# Patient Record
Sex: Female | Born: 1955 | Hispanic: No | Marital: Married | Smoking: Current every day smoker
Health system: Southern US, Community
[De-identification: ages and names within clinical notes are randomized; demographics above are authoritative.]

## PROBLEM LIST (undated history)

## (undated) DIAGNOSIS — F411 Generalized anxiety disorder: Secondary | ICD-10-CM

## (undated) DIAGNOSIS — R591 Generalized enlarged lymph nodes: Secondary | ICD-10-CM

## (undated) DIAGNOSIS — R636 Underweight: Secondary | ICD-10-CM

## (undated) HISTORY — DX: Generalized enlarged lymph nodes: R59.1

## (undated) HISTORY — PX: ABDOMINAL HYSTERECTOMY: SHX81

## (undated) HISTORY — DX: Generalized anxiety disorder: F41.1

## (undated) HISTORY — DX: Underweight: R63.6

---

## 2004-12-27 ENCOUNTER — Other Ambulatory Visit: Admission: RE | Admit: 2004-12-27 | Discharge: 2004-12-27 | Payer: Self-pay | Admitting: Family Medicine

## 2006-04-21 ENCOUNTER — Other Ambulatory Visit: Admission: RE | Admit: 2006-04-21 | Discharge: 2006-04-21 | Payer: Self-pay | Admitting: Family Medicine

## 2010-12-03 ENCOUNTER — Ambulatory Visit (INDEPENDENT_AMBULATORY_CARE_PROVIDER_SITE_OTHER): Payer: Self-pay | Admitting: Internal Medicine

## 2013-11-01 ENCOUNTER — Ambulatory Visit (INDEPENDENT_AMBULATORY_CARE_PROVIDER_SITE_OTHER): Payer: PRIVATE HEALTH INSURANCE | Admitting: Family Medicine

## 2013-11-01 ENCOUNTER — Encounter: Payer: Self-pay | Admitting: Family Medicine

## 2013-11-01 ENCOUNTER — Telehealth: Payer: Self-pay | Admitting: General Practice

## 2013-11-01 ENCOUNTER — Encounter (INDEPENDENT_AMBULATORY_CARE_PROVIDER_SITE_OTHER): Payer: Self-pay

## 2013-11-01 VITALS — BP 108/67 | HR 80 | Temp 98.4°F | Ht 62.0 in | Wt 102.4 lb

## 2013-11-01 DIAGNOSIS — M549 Dorsalgia, unspecified: Secondary | ICD-10-CM

## 2013-11-01 MED ORDER — HYDROCODONE-ACETAMINOPHEN 5-325 MG PO TABS: 1.0000 | ORAL_TABLET | Freq: Four times a day (QID) | ORAL | Status: DC | PRN

## 2013-11-01 NOTE — Telephone Encounter (Signed)
appt for today given

## 2013-11-01 NOTE — Progress Notes (Signed)
   Subjective:    Patient ID: [name], female    DOB: 1955/09/11, 58 y.o.   MRN: [id number]  HPI This 58 y.o. female presents for evaluation of back pain.  She has been cleaning and mopping At work and her back has been becoming worse.   Review of Systems No chest pain, SOB, HA, dizziness, vision change, N/V, diarrhea, constipation, dysuria, urinary urgency or frequency, myalgias, arthralgias or rash.     Objective:   Physical Exam Vital signs noted  Well developed well nourished female.  HEENT - Head atraumatic Normocephalic                Eyes - PERRLA, Conjuctiva - clear Sclera- Clear EOMI               Ears - EAC's Wnl TM's Wnl Gross Hearing WNL                Nose - Nares patent                 Throat - oropharanx wnl Respiratory - Lungs CTA bilateral Cardiac - RRR S1 and S2 without murmur GI - Abdomen soft Nontender and bowel sounds active x 4 Extremities - No edema. Neuro - Grossly intact.       Assessment & Plan:  Back pain Norco 5/325 one po qid prn pain #30 Take motrin 800mg  one po tid x 10 days Out of work for a week. Heating pad to back, rest, no lifting or mopping and follow up in a week if not better.  Lysbeth Penner FNP

## 2013-11-07 ENCOUNTER — Ambulatory Visit (HOSPITAL_COMMUNITY)
Admission: RE | Admit: 2013-11-07 | Discharge: 2013-11-07 | Disposition: A | Discharge status: Home / Self Care | Payer: PRIVATE HEALTH INSURANCE | Source: Ambulatory Visit | Attending: Family Medicine | Admitting: Family Medicine

## 2013-11-07 ENCOUNTER — Ambulatory Visit (INDEPENDENT_AMBULATORY_CARE_PROVIDER_SITE_OTHER): Payer: PRIVATE HEALTH INSURANCE

## 2013-11-07 ENCOUNTER — Ambulatory Visit (INDEPENDENT_AMBULATORY_CARE_PROVIDER_SITE_OTHER): Payer: PRIVATE HEALTH INSURANCE | Admitting: Family Medicine

## 2013-11-07 ENCOUNTER — Encounter: Payer: Self-pay | Admitting: Family Medicine

## 2013-11-07 VITALS — BP 136/86 | HR 63 | Temp 97.8°F | Ht 62.0 in | Wt 102.0 lb

## 2013-11-07 DIAGNOSIS — B029 Zoster without complications: Secondary | ICD-10-CM

## 2013-11-07 DIAGNOSIS — M549 Dorsalgia, unspecified: Secondary | ICD-10-CM

## 2013-11-07 DIAGNOSIS — F411 Generalized anxiety disorder: Secondary | ICD-10-CM

## 2013-11-07 MED ORDER — ALPRAZOLAM 0.5 MG PO TABS: ORAL_TABLET | ORAL | Status: DC

## 2013-11-07 MED ORDER — ACYCLOVIR 800 MG PO TABS: 800.0000 mg | ORAL_TABLET | Freq: Every day | ORAL | Status: DC

## 2013-11-07 MED ORDER — HYDROCODONE-ACETAMINOPHEN 5-325 MG PO TABS: 2.0000 | ORAL_TABLET | Freq: Four times a day (QID) | ORAL | Status: DC | PRN

## 2013-11-07 NOTE — Addendum Note (Signed)
Addended by: Lysbeth Penner on: 11/07/2013 04:12 PM   Modules accepted: Orders

## 2013-11-07 NOTE — Progress Notes (Signed)
   Subjective:    Patient ID: Erika Turner, female    DOB: 1955-11-25, 58 y.o.   MRN: [id number]  HPI This 58 y.o. female presents for evaluation of severe right back pain radiating down her right knee to right foot.  She is having pain and difficulty walking.  She was seen last week for this and dx with sciatica.  She is intolerant to steroids and has been taking motrin 800mg  po tid.  She has a rash on her left back.   Review of Systems C/o rash and back pain radiating down to her foot No chest pain, SOB, HA, dizziness, vision change, N/V, diarrhea, constipation, dysuria, urinary urgency or frequency.     Objective:   Physical Exam  Vital signs noted  Well developed well nourished female.  HEENT - Head atraumatic Normocephalic                Eyes - PERRLA, Conjuctiva - clear Sclera- Clear EOMI                Ears - EAC's Wnl TM's Wnl Gross Hearing WNL                 Throat - oropharanx wnl Respiratory - Lungs CTA bilateral Cardiac - RRR S1 and S2 without murmur GI - Abdomen soft Nontender and bowel sounds active x 4 MS - TTP right back and weakness in her right foot  Skin - Shingles left back  Xray - scoliosis  Prelimnary reading by Gwyndolyn Saxon Trinh Sanjose,FNP    Assessment & Plan:  Back pain - Plan: DG Lumbar Spine 2-3 Views, MR Lumbar Spine Wo Contrast  Shingles - Plan: acyclovir (ZOVIRAX) 800 MG tablet po 5 x day x 7 days  Lysbeth Penner FNP

## 2013-11-08 ENCOUNTER — Ambulatory Visit: Payer: PRIVATE HEALTH INSURANCE | Admitting: Family Medicine

## 2013-11-08 ENCOUNTER — Encounter: Payer: Self-pay | Admitting: Family Medicine

## 2013-11-08 ENCOUNTER — Ambulatory Visit (INDEPENDENT_AMBULATORY_CARE_PROVIDER_SITE_OTHER): Payer: PRIVATE HEALTH INSURANCE | Admitting: Family Medicine

## 2013-11-08 VITALS — BP 112/59 | HR 59 | Temp 98.4°F | Ht 62.0 in | Wt 101.4 lb

## 2013-11-08 DIAGNOSIS — M549 Dorsalgia, unspecified: Secondary | ICD-10-CM

## 2013-11-08 NOTE — Progress Notes (Signed)
   Subjective:    Patient ID: Erika Turner, female    DOB: October 26, 1955, 58 y.o.   MRN: [id number]  HPI This 58 y.o. female presents for evaluation of back pain.  She has had MRI of the LS spine and it shows no stenosis or HNP and mild lumbar arthritis.  She is still having some back pain and is having Difficulty walking.   Review of Systems C/o back pain   No chest pain, SOB, HA, dizziness, vision change, N/V, diarrhea, constipation, dysuria, urinary urgency or frequency, myalgias, arthralgias or rash.  Objective:   Physical Exam Vital signs noted  Well developed well nourished female.  HEENT - Head atraumatic Normocephalic                Eyes - PERRLA, Conjuctiva - clear Sclera- Clear EOMI                Ears - EAC's Wnl TM's Wnl Gross Hearing WNL                Throat - oropharanx wnl Respiratory - Lungs CTA bilateral Cardiac - RRR S1 and S2 without murmur GI - Abdomen soft Nontender and bowel sounds active x 4 LS spine - TTP lumbar spine.       Assessment & Plan:  Back pain - Plan: Ambulatory referral to Physical Therapy Continue norco pain meds And follow up prn.  Out of work for a week.  Lysbeth Penner FNP

## 2013-11-14 ENCOUNTER — Ambulatory Visit: Payer: PRIVATE HEALTH INSURANCE | Admitting: Family Medicine

## 2014-05-15 ENCOUNTER — Encounter: Payer: Self-pay | Admitting: Family Medicine

## 2014-05-15 ENCOUNTER — Ambulatory Visit (INDEPENDENT_AMBULATORY_CARE_PROVIDER_SITE_OTHER): Payer: PRIVATE HEALTH INSURANCE | Admitting: Family Medicine

## 2014-05-15 VITALS — BP 121/65 | HR 100 | Temp 98.3°F | Ht 62.0 in | Wt 99.6 lb

## 2014-05-15 DIAGNOSIS — B029 Zoster without complications: Secondary | ICD-10-CM

## 2014-05-15 MED ORDER — HYDROXYZINE HCL 25 MG PO TABS: 25.0000 mg | ORAL_TABLET | Freq: Three times a day (TID) | ORAL | Status: DC | PRN

## 2014-05-15 MED ORDER — ACYCLOVIR 800 MG PO TABS: 800.0000 mg | ORAL_TABLET | Freq: Every day | ORAL | Status: DC

## 2014-05-15 NOTE — Progress Notes (Signed)
   Subjective:    Patient ID: Erika Turner, female    DOB: 11-10-1955, 58 y.o.   MRN: [id number]  HPI This 58 y.o. female presents for evaluation of painful rash on left forehead and scalp.  She denies pain in her left eye or change in her visual acuity.  She has had hx of shingles in the past.  She has not had shingles vaccination.   Review of Systems C/o rash, pruritis, and right scalp pain. No chest pain, SOB, HA, dizziness, vision change, N/V, diarrhea, constipation, myalgias, arthralgias.     Objective:   Physical Exam  Vital signs noted  Well developed well nourished female.  HEENT - Head atraumatic Normocephalic Respiratory - Lungs CTA bilateral Cardiac - RRR S1 and S2 without murmur GI - Abdomen soft Nontender and bowel sounds active x 4 Skin - Erythematous raised rash above left eye and left forehead and scalp.     Assessment & Plan:  Shingles - Plan: acyclovir (ZOVIRAX) 800 MG tablet one po 5xday for 7 days #35. Vistaril 25mg  one po tid prn #30.  Take norco pain med from home.  Recommend she see eye doctor today since the shingles is close to her right eye.  Recommend she get shingles vaccination after rash gone.   Lysbeth Penner FNP

## 2017-01-21 ENCOUNTER — Ambulatory Visit (INDEPENDENT_AMBULATORY_CARE_PROVIDER_SITE_OTHER): Payer: PRIVATE HEALTH INSURANCE | Admitting: Family

## 2017-01-21 ENCOUNTER — Encounter: Payer: Self-pay | Admitting: Family

## 2017-01-21 VITALS — BP 127/76 | HR 68 | Temp 97.5°F | Ht 62.0 in | Wt 97.8 lb

## 2017-01-21 DIAGNOSIS — R636 Underweight: Secondary | ICD-10-CM

## 2017-01-21 DIAGNOSIS — R079 Chest pain, unspecified: Secondary | ICD-10-CM

## 2017-01-21 DIAGNOSIS — F411 Generalized anxiety disorder: Secondary | ICD-10-CM

## 2017-01-21 DIAGNOSIS — Z1211 Encounter for screening for malignant neoplasm of colon: Secondary | ICD-10-CM

## 2017-01-21 DIAGNOSIS — Z0000 Encounter for general adult medical examination without abnormal findings: Secondary | ICD-10-CM

## 2017-01-21 DIAGNOSIS — Z1159 Encounter for screening for other viral diseases: Secondary | ICD-10-CM

## 2017-01-21 DIAGNOSIS — Z114 Encounter for screening for human immunodeficiency virus [HIV]: Secondary | ICD-10-CM

## 2017-01-21 HISTORY — DX: Generalized anxiety disorder: F41.1

## 2017-01-21 HISTORY — DX: Underweight: R63.6

## 2017-01-21 MED ORDER — ESCITALOPRAM OXALATE 10 MG PO TABS: 10.0000 mg | ORAL_TABLET | Freq: Every day | ORAL | 3 refills | Status: DC

## 2017-01-21 NOTE — Progress Notes (Signed)
Subjective:    Patient ID: Erika Turner, female    DOB: [birth year], 61 y.o.   MRN: [id number]  PT presents to the office today to establish care and CPE.  Chest Pain   This is a new problem. The current episode started more than 1 month ago. The onset quality is gradual. The problem occurs intermittently. The problem has been waxing and waning. The pain is present in the substernal region. The pain is moderate. The quality of the pain is described as squeezing. The pain does not radiate. Pertinent negatives include no abdominal pain, diaphoresis, dizziness, irregular heartbeat, palpitations, shortness of breath or syncope. The pain is aggravated by emotional upset. She has tried rest and analgesics for the symptoms. The treatment provided mild relief.  Pertinent negatives for family medical history include: no CAD and no heart disease.  Anxiety  Presents for follow-up visit. Symptoms include chest pain, excessive worry, irritability and nervous/anxious behavior. Patient reports no dizziness, palpitations or shortness of breath. Symptoms occur most days.        Review of Systems  Constitutional: Positive for irritability. Negative for diaphoresis.  Respiratory: Negative for shortness of breath.   Cardiovascular: Positive for chest pain. Negative for palpitations and syncope.  Gastrointestinal: Negative for abdominal pain.  Neurological: Negative for dizziness.  Psychiatric/Behavioral: The patient is nervous/anxious.   All other systems reviewed and are negative.  Social History   Social History  . Marital status: Married    Spouse name: N/A  . Number of children: N/A  . Years of education: N/A   Social History Main Topics  . Smoking status: Former Smoker    Packs/day: 1.00    Types: Cigarettes    Start date: 11/02/1978  . Smokeless tobacco: Never Used  . Alcohol use No  . Drug use: No  . Sexual activity: Not Asked   Other Topics Concern  . None   Social History Narrative    . None   Family History  Problem Relation Age of Onset  . Diabetes Mother   . Cancer Father        Objective:   Physical Exam  Constitutional: She is oriented to person, place, and time. She appears well-developed and well-nourished. No distress.  HENT:  Head: Normocephalic.  Right Ear: External ear normal.  Left Ear: External ear normal.  Mouth/Throat: Oropharynx is clear and moist.  Eyes: Pupils are equal, round, and reactive to light.  Neck: Normal range of motion. Neck supple. No thyromegaly present.  Cardiovascular: Normal rate, regular rhythm, normal heart sounds and intact distal pulses.   No murmur heard. Pulmonary/Chest: Effort normal and breath sounds normal. No respiratory distress. She has no wheezes.  Abdominal: Soft. Bowel sounds are normal. She exhibits no distension. There is no tenderness.  Musculoskeletal: Normal range of motion. She exhibits no edema or tenderness.  Neurological: She is alert and oriented to person, place, and time.  Skin: Skin is warm and dry.  Psychiatric: She has a normal mood and affect. Her behavior is normal. Judgment and thought content normal.  Vitals reviewed.     BP 127/76   Pulse 68   Temp 97.5 F (36.4 C) (Oral)   Ht '5\' 2"'$  (1.575 m)   Wt 97 lb 12.8 oz (44.4 kg)   BMI 17.89 kg/m      Assessment & Plan:  1. Chest pain, unspecified type - EKG 12-Lead - CMP14+EGFR - Ambulatory referral to Cardiology  2. Underweight - CMP14+EGFR  3. GAD (generalized  anxiety disorder) -Pt started on lexapro 10 mg  Stress management RTo in 6 weeks - CMP14+EGFR - escitalopram (LEXAPRO) 10 MG tablet; Take 1 tablet (10 mg total) by mouth daily.  Dispense: 90 tablet; Refill: 3  4. Annual physical exam - CMP14+EGFR - Lipid panel - Thyroid Panel With TSH - VITAMIN D 25 Hydroxy (Vit-D Deficiency, Fractures) - Hepatitis C antibody - HIV antibody  5. Need for hepatitis C screening test - CMP14+EGFR - Hepatitis C antibody  6.  Encounter for screening for HIV - CMP14+EGFR - HIV antibody  7. Colon cancer screening - Fecal occult blood, imunochemical; Future   Continue all meds Labs pending Health Maintenance reviewed Diet and exercise encouraged RTO 6 weeks   Evelina Dun, FNP

## 2017-01-21 NOTE — Addendum Note (Signed)
Addended by: Evelina Dun A on: 01/21/2017 07:46 PM   Modules accepted: Level of Service

## 2017-01-21 NOTE — Patient Instructions (Signed)

## 2017-01-22 LAB — CMP14+EGFR
A/G RATIO: 2.3 — AB (ref 1.2–2.2)
ALK PHOS: 93 IU/L (ref 39–117)
ALT: 10 IU/L (ref 0–32)
AST: 16 IU/L (ref 0–40)
Albumin: 4.5 g/dL (ref 3.5–5.5)
BUN/Creatinine Ratio: 12 (ref 9–23)
BUN: 8 mg/dL (ref 6–24)
Bilirubin Total: 0.2 mg/dL (ref 0.0–1.2)
CO2: 24 mmol/L (ref 18–29)
Calcium: 9.5 mg/dL (ref 8.7–10.2)
Chloride: 105 mmol/L (ref 96–106)
Creatinine, Ser: 0.67 mg/dL (ref 0.57–1.00)
GFR calc Af Amer: 111 mL/min/{1.73_m2} (ref >59)
GFR calc non Af Amer: 97 mL/min/{1.73_m2} (ref >59)
GLUCOSE: 78 mg/dL (ref 65–99)
Globulin, Total: 2 g/dL (ref 1.5–4.5)
POTASSIUM: 4.1 mmol/L (ref 3.5–5.2)
Sodium: 144 mmol/L (ref 134–144)
Total Protein: 6.5 g/dL (ref 6.0–8.5)

## 2017-01-22 LAB — THYROID PANEL WITH TSH
FREE THYROXINE INDEX: 1.9 (ref 1.2–4.9)
T3 UPTAKE RATIO: 23 % — AB (ref 24–39)
T4, Total: 8.3 ug/dL (ref 4.5–12.0)
TSH: 2.03 u[IU]/mL (ref 0.450–4.500)

## 2017-01-22 LAB — LIPID PANEL
CHOLESTEROL TOTAL: 171 mg/dL (ref 100–199)
Chol/HDL Ratio: 2.6 ratio (ref 0.0–4.4)
HDL: 66 mg/dL (ref >39)
LDL Calculated: 83 mg/dL (ref 0–99)
Triglycerides: 109 mg/dL (ref 0–149)
VLDL CHOLESTEROL CAL: 22 mg/dL (ref 5–40)

## 2017-01-22 LAB — HEPATITIS C ANTIBODY

## 2017-01-22 LAB — HIV ANTIBODY (ROUTINE TESTING W REFLEX): HIV Screen 4th Generation wRfx: NONREACTIVE

## 2017-01-22 LAB — VITAMIN D 25 HYDROXY (VIT D DEFICIENCY, FRACTURES): VIT D 25 HYDROXY: 30.5 ng/mL (ref 30.0–100.0)

## 2017-02-20 DIAGNOSIS — R079 Chest pain, unspecified: Secondary | ICD-10-CM | POA: Insufficient documentation

## 2017-03-09 ENCOUNTER — Encounter: Payer: Self-pay | Admitting: Family

## 2017-03-09 ENCOUNTER — Ambulatory Visit (INDEPENDENT_AMBULATORY_CARE_PROVIDER_SITE_OTHER): Payer: PRIVATE HEALTH INSURANCE | Admitting: Family

## 2017-03-09 VITALS — BP 116/72 | HR 56 | Temp 97.9°F | Ht 62.0 in | Wt 96.6 lb

## 2017-03-09 DIAGNOSIS — Z1211 Encounter for screening for malignant neoplasm of colon: Secondary | ICD-10-CM | POA: Diagnosis not present

## 2017-03-09 DIAGNOSIS — F411 Generalized anxiety disorder: Secondary | ICD-10-CM

## 2017-03-09 NOTE — Progress Notes (Signed)
   Subjective:    Patient ID: Erika Turner, female    DOB: December 13, 1955, 61 y.o.   MRN: [id number]  Pt presents to the office today to recheck GAD. PT states she has noticed an improvement and "don't feel like I'm on the edge now".   Anxiety  Presents for follow-up visit. Symptoms include irritability, nervous/anxious behavior and restlessness. Patient reports no decreased concentration, depressed mood, excessive worry or panic. Symptoms occur rarely. The quality of sleep is good.        Review of Systems  Constitutional: Positive for irritability.  Psychiatric/Behavioral: Negative for decreased concentration. The patient is nervous/anxious.   All other systems reviewed and are negative.      Objective:   Physical Exam  Constitutional: She is oriented to person, place, and time. She appears well-developed and well-nourished. No distress.  HENT:  Head: Normocephalic and atraumatic.  Eyes: Pupils are equal, round, and reactive to light.  Neck: Normal range of motion. Neck supple. No thyromegaly present.  Cardiovascular: Normal rate, regular rhythm, normal heart sounds and intact distal pulses.   No murmur heard. Pulmonary/Chest: Effort normal and breath sounds normal. No respiratory distress. She has no wheezes.  Abdominal: Soft. Bowel sounds are normal. She exhibits no distension. There is no tenderness.  Musculoskeletal: Normal range of motion. She exhibits no edema or tenderness.  Neurological: She is alert and oriented to person, place, and time.  Skin: Skin is warm and dry.  Psychiatric: She has a normal mood and affect. Her behavior is normal. Judgment and thought content normal.  Vitals reviewed.    BP 116/72   Pulse (!) 56   Temp 97.9 F (36.6 C) (Oral)   Ht 5\' 2"  (1.575 m)   Wt 96 lb 9.6 oz (43.8 kg)   BMI 17.67 kg/m      Assessment & Plan:  1. GAD (generalized anxiety disorder) Continue lexapro Stress management discussed RTO 1 year  2. Colon cancer  screening - Fecal occult blood, imunochemical   Evelina Dun, FNP

## 2017-03-09 NOTE — Patient Instructions (Signed)

## 2017-03-10 NOTE — Progress Notes (Deleted)
Cardiology Office Note   Date:  03/10/2017   ID:  Erika Turner, DOB July 28, 1956, MRN [id number]  PCP:  Erika Euler, MD  Cardiologist:   Minus Breeding, MD  Referring:  ***  No chief complaint on file.     History of Present Illness: Erika Turner is a 61 y.o. female who presents for ***     Past Medical History:  Diagnosis Date  . GAD (generalized anxiety disorder) 01/21/2017  . Underweight 01/21/2017    Past Surgical History:  Procedure Laterality Date  . ABDOMINAL HYSTERECTOMY       Current Outpatient Prescriptions  Medication Sig Dispense Refill  . escitalopram (LEXAPRO) 10 MG tablet Take 1 tablet (10 mg total) by mouth daily. 90 tablet 3   No current facility-administered medications for this visit.     Allergies:   Biaxin [clarithromycin]    Social History:  The patient  reports that she has quit smoking. Her smoking use included Cigarettes. She started smoking about 38 years ago. She smoked 1.00 pack per day. She has never used smokeless tobacco. She reports that she does not drink alcohol or use drugs.   Family History:  The patient's ***family history includes Cancer in her father; Diabetes in her mother.    ROS:  Please see the history of present illness.   Otherwise, review of systems are positive for {NONE DEFAULTED:18576::"none"}.   All other systems are reviewed and negative.    PHYSICAL EXAM: VS:  There were no vitals taken for this visit. , BMI There is no height or weight on file to calculate BMI. GENERAL:  Well appearing HEENT:  Pupils equal round and reactive, fundi not visualized, oral mucosa unremarkable NECK:  No jugular venous distention, waveform within normal limits, carotid upstroke brisk and symmetric, no bruits, no thyromegaly LYMPHATICS:  No cervical, inguinal adenopathy LUNGS:  Clear to auscultation bilaterally BACK:  No CVA tenderness CHEST:  Unremarkable HEART:  PMI not displaced or sustained,S1 and S2 within normal  limits, no S3, no S4, no clicks, no rubs, *** murmurs ABD:  Flat, positive bowel sounds normal in frequency in pitch, no bruits, no rebound, no guarding, no midline pulsatile mass, no hepatomegaly, no splenomegaly EXT:  2 plus pulses throughout, no edema, no cyanosis no clubbing SKIN:  No rashes no nodules NEURO:  Cranial nerves II through XII grossly intact, motor grossly intact throughout PSYCH:  Cognitively intact, oriented to person place and time    EKG:  EKG {ACTION; IS/IS SWF:09323557} ordered today. The ekg ordered today demonstrates ***   Recent Labs: 01/21/2017: ALT 10; BUN 8; Creatinine, Ser 0.67; Potassium 4.1; Sodium 144; TSH 2.030    Lipid Panel    Component Value Date/Time   CHOL 171 01/21/2017 1008   TRIG 109 01/21/2017 1008   HDL 66 01/21/2017 1008   CHOLHDL 2.6 01/21/2017 1008   LDLCALC 83 01/21/2017 1008      Wt Readings from Last 3 Encounters:  03/09/17 96 lb 9.6 oz (43.8 kg)  01/21/17 97 lb 12.8 oz (44.4 kg)  05/15/14 99 lb 9.6 oz (45.2 kg)      Other studies Reviewed: Additional studies/ records that were reviewed today include: ***. Review of the above records demonstrates:  Please see elsewhere in the note.  ***   ASSESSMENT AND PLAN:  ***   Current medicines are reviewed at length with the patient today.  The patient {ACTIONS; HAS/DOES NOT HAVE:19233} concerns regarding medicines.  The following changes have been  made:  {PLAN; NO CHANGE:13088:s}  Labs/ tests ordered today include: *** No orders of the defined types were placed in this encounter.    Disposition:   FU with ***    Signed, Minus Breeding, MD  03/10/2017 9:51 PM    [facility]

## 2017-03-11 ENCOUNTER — Encounter: Payer: Self-pay | Admitting: Cardiology

## 2017-03-11 ENCOUNTER — Ambulatory Visit (INDEPENDENT_AMBULATORY_CARE_PROVIDER_SITE_OTHER): Payer: PRIVATE HEALTH INSURANCE | Admitting: Cardiology

## 2017-03-11 VITALS — BP 128/80 | HR 72 | Ht 62.0 in | Wt 96.0 lb

## 2017-03-11 DIAGNOSIS — Z720 Tobacco use: Secondary | ICD-10-CM | POA: Diagnosis not present

## 2017-03-11 DIAGNOSIS — R072 Precordial pain: Secondary | ICD-10-CM | POA: Diagnosis not present

## 2017-03-11 NOTE — Progress Notes (Signed)
Cardiology Office Note   Date:  03/11/2017   ID:  Erika Turner, DOB [birth year], MRN [id number]  PCP:  Timmothy Euler, MD  Cardiologist:   Minus Breeding, MD  Referring:   Timmothy Euler, MD  Chief Complaint  Patient presents with  . Chest Pain      History of Present Illness: Erika Turner is a 61 y.o. female is referred by Timmothy Euler, MD for evaluation of chest pain.  The patient has no history of coronary artery disease. 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 She developed chest discomfort. This was 7 out of 10. It was intermittent chest. Was a cramping. She has to squat down. She said that her heart started racing after the chest discomfort. She had nausea but no vomiting. She was diaphoretic. She said it all lasted for about 5 minutes and went away spontaneously. She has some very mild episodes prior to this. She's had one brief less severe episodes since that time. She otherwise is active at work and doing her activities of daily living. She's not been able to bring on any of these symptoms. She did see her primary provider and was thought possibly to have some anxiety and started on Lexapro. She says that she has felt better since that time. She denies any prior cardiac history and has had no prior workup. I did review the office records and she had labs which were normal thyroid studies. Her chemistry was normal. EKG at that time demonstrated no acute abnormalities.   Past Medical History:  Diagnosis Date  . GAD (generalized anxiety disorder) 01/21/2017  . Underweight 01/21/2017    Past Surgical History:  Procedure Laterality Date  . ABDOMINAL HYSTERECTOMY    . CESAREAN SECTION     x 2     Current Outpatient Prescriptions  Medication Sig Dispense Refill  . diphenhydramine-acetaminophen (TYLENOL PM) 25-500 MG TABS tablet Take 1 tablet by mouth at bedtime as needed.    Marland Kitchen escitalopram  (LEXAPRO) 10 MG tablet Take 1 tablet (10 mg total) by mouth daily. 90 tablet 3   No current facility-administered medications for this visit.     Allergies:   Biaxin [clarithromycin]    Social History:  The patient  reports that she has been smoking Cigarettes.  She started smoking about 38 years ago. She has been smoking about 1.00 pack per day. She has never used smokeless tobacco. She reports that she does not drink alcohol or use drugs.   Family History:  The patient's family history includes Cancer in her father; Diabetes in her mother; Heart disease in her mother.    ROS:  Please see the history of present illness.   Otherwise, review of systems are positive for diarrhea.   All other systems are reviewed and negative.    PHYSICAL EXAM: VS:  BP 128/80   Pulse 72   Ht 5\' 2"  (1.575 m)   Wt 96 lb (43.5 kg)   BMI 17.56 kg/m  , BMI Body mass index is 17.56 kg/m. GENERAL:  Well appearing HEENT:  Pupils equal round and reactive, fundi not visualized, oral mucosa unremarkable NECK:  No jugular venous distention, waveform within normal limits, carotid upstroke brisk and symmetric, no bruits, no thyromegaly LYMPHATICS:  No cervical, inguinal adenopathy LUNGS:  Clear to auscultation bilaterally BACK:  No CVA tenderness CHEST:  Unremarkable HEART:  PMI not displaced or  sustained,S1 and S2 within normal limits, no S3, no S4, no clicks, no rubs, no murmurs ABD:  Flat, positive bowel sounds normal in frequency in pitch, no bruits, no rebound, no guarding, no midline pulsatile mass, no hepatomegaly, no splenomegaly EXT:  2 plus pulses throughout, no edema, no cyanosis no clubbing SKIN:  No rashes no nodules NEURO:  Cranial nerves II through XII grossly intact, motor grossly intact throughout PSYCH:  Cognitively intact, oriented to person place and time    EKG:  EKG is not ordered today. The ekg ordered today demonstrates sinus rhythm, rate 49, axis within normal limits, intervals within  normal limits, no acute ST-T wave changes.   Recent Labs: 01/21/2017: ALT 10; BUN 8; Creatinine, Ser 0.67; Potassium 4.1; Sodium 144; TSH 2.030    Lipid Panel    Component Value Date/Time   CHOL 171 01/21/2017 1008   TRIG 109 01/21/2017 1008   HDL 66 01/21/2017 1008   CHOLHDL 2.6 01/21/2017 1008   LDLCALC 83 01/21/2017 1008      Wt Readings from Last 3 Encounters:  03/11/17 96 lb (43.5 kg)  03/09/17 96 lb 9.6 oz (43.8 kg)  01/21/17 97 lb 12.8 oz (44.4 kg)      Other studies Reviewed: Additional studies/ records that were reviewed today include: none. Review of the above records demonstrates:  Please see elsewhere in the note.     ASSESSMENT AND PLAN:  CHEST PAIN:  The etiology of this is not clear. It doesn't sound like tachycardia palpitations as she was not having these as the initial complaint. He could've been panic but she's not had a panic attack in the past. Could've been GI. Certainly with her smoking history this could have been angina. I will bring the patient back for a POET (Plain Old Exercise Test). This will allow me to screen for obstructive coronary disease, risk stratify and very importantly provide a prescription for exercise.  TOBACCO ABUSE:    She does not think she could stop smoking nor does she want to. We talked about this.   Current medicines are reviewed at length with the patient today.  The patient does not have concerns regarding medicines.  The following changes have been made:  no change  Labs/ tests ordered today include:   Orders Placed This Encounter  Procedures  . Exercise Tolerance Test     Disposition:   FU with me as needed based on future symptoms or results of the stress test.      Signed, Minus Breeding, MD  03/11/2017 10:08 PM    [place]

## 2017-03-11 NOTE — Patient Instructions (Signed)
Medication Instructions:  The current medical regimen is effective;  continue present plan and medications.  Testing/Procedures: Your physician has requested that you have an exercise tolerance test. For further information please visit [email or web address]. Please also follow instruction sheet, as given.  Follow-Up: Further follow up will be based on the results of the above testing.  If you need a refill on your cardiac medications before your next appointment, please call your pharmacy.  Thank you for choosing [facility]!!

## 2017-03-12 ENCOUNTER — Encounter: Payer: Self-pay | Admitting: Cardiology

## 2017-03-12 LAB — FECAL OCCULT BLOOD, IMMUNOCHEMICAL: FECAL OCCULT BLD: NEGATIVE

## 2018-01-16 ENCOUNTER — Other Ambulatory Visit: Payer: Self-pay | Admitting: Family

## 2018-01-16 DIAGNOSIS — F411 Generalized anxiety disorder: Secondary | ICD-10-CM

## 2018-01-19 ENCOUNTER — Ambulatory Visit (INDEPENDENT_AMBULATORY_CARE_PROVIDER_SITE_OTHER): Payer: Managed Care, Other (non HMO) | Admitting: Family

## 2018-01-19 ENCOUNTER — Encounter: Payer: Self-pay | Admitting: Family

## 2018-01-19 VITALS — BP 128/79 | HR 77 | Temp 98.2°F | Ht 62.0 in | Wt 100.6 lb

## 2018-01-19 DIAGNOSIS — Z1212 Encounter for screening for malignant neoplasm of rectum: Secondary | ICD-10-CM

## 2018-01-19 DIAGNOSIS — L989 Disorder of the skin and subcutaneous tissue, unspecified: Secondary | ICD-10-CM

## 2018-01-19 DIAGNOSIS — Z1211 Encounter for screening for malignant neoplasm of colon: Secondary | ICD-10-CM

## 2018-01-19 DIAGNOSIS — F411 Generalized anxiety disorder: Secondary | ICD-10-CM

## 2018-01-19 DIAGNOSIS — Z0000 Encounter for general adult medical examination without abnormal findings: Secondary | ICD-10-CM

## 2018-01-19 DIAGNOSIS — Z720 Tobacco use: Secondary | ICD-10-CM

## 2018-01-19 DIAGNOSIS — R636 Underweight: Secondary | ICD-10-CM

## 2018-01-19 MED ORDER — ESCITALOPRAM OXALATE 10 MG PO TABS: 10.0000 mg | ORAL_TABLET | Freq: Every day | ORAL | 3 refills | Status: AC

## 2018-01-19 NOTE — Progress Notes (Signed)
   Subjective:    Patient ID: Erika Turner, female    DOB: [birth year], 62 y.o.   MRN: [id number]  Chief Complaint  Patient presents with  . Annual Exam    no pap, medication refill   PT presents to the office today for CPE without pap.  Anxiety  Presents for follow-up visit. Symptoms include excessive worry and nervous/anxious behavior. Patient reports no decreased concentration, dizziness or irritability. Symptoms occur occasionally. The severity of symptoms is moderate. The quality of sleep is good.        Review of Systems  Constitutional: Negative for irritability.  Neurological: Negative for dizziness.  Psychiatric/Behavioral: Negative for decreased concentration. The patient is nervous/anxious.   All other systems reviewed and are negative.      Objective:   Physical Exam  Constitutional: She is oriented to person, place, and time. She appears well-developed and well-nourished. No distress.  HENT:  Head: Normocephalic and atraumatic.  Right Ear: External ear normal. Tympanic membrane is erythematous (mildly).  Left Ear: External ear normal.  Nose: Mucosal edema and rhinorrhea present.  Mouth/Throat: Oropharynx is clear and moist.  Eyes: Pupils are equal, round, and reactive to light.  Neck: Normal range of motion. Neck supple. No thyromegaly present.  Cardiovascular: Normal rate, regular rhythm, normal heart sounds and intact distal pulses.  No murmur heard. Pulmonary/Chest: Effort normal and breath sounds normal. No respiratory distress. She has no wheezes.  Abdominal: Soft. Bowel sounds are normal. She exhibits no distension. There is no tenderness.  Musculoskeletal: Normal range of motion. She exhibits no edema or tenderness.  Neurological: She is alert and oriented to person, place, and time. She has normal reflexes. No cranial nerve deficit.  Skin: Skin is warm and dry. Lesion (flaky nonhealing leson on middle of part of hair. States has been there for over a  year) noted.  Psychiatric: She has a normal mood and affect. Her behavior is normal. Judgment and thought content normal.  Vitals reviewed.     BP 128/79   Pulse 77   Temp 98.2 F (36.8 C) (Oral)   Ht '5\' 2"'$  (1.575 m)   Wt 100 lb 9.6 oz (45.6 kg)   BMI 18.40 kg/m      Assessment & Plan:  Erika Turner comes in today with chief complaint of Annual Exam (no pap, medication refill)   Diagnosis and orders addressed:  1. GAD (generalized anxiety disorder) - CMP14+EGFR - CBC with Differential/Platelet - escitalopram (LEXAPRO) 10 MG tablet; Take 1 tablet (10 mg total) by mouth daily.  Dispense: 90 tablet; Refill: 3  2. Underweight - CMP14+EGFR - CBC with Differential/Platelet  3. Tobacco abuse - CMP14+EGFR - CBC with Differential/Platelet  4. Annual physical exam - CMP14+EGFR - CBC with Differential/Platelet - Lipid panel - TSH  5. Colon cancer screening - Cologuard - CMP14+EGFR - CBC with Differential/Platelet  6. Screening for malignant neoplasm of the rectum - Cologuard - CMP14+EGFR - CBC with Differential/Platelet  7. Scalp lesion Pt does not wish to have derm referral at this time. Not widespread, seborrhoic dermatitis vs basal cell. Looks more like seborrhoic derm, but I worry because it is in the area of sun exposure. Discussed she should follow up with Derm and she said she would like to think about it.   Labs pending Health Maintenance reviewed Diet and exercise encouraged  Follow up plan: 1 year    Evelina Dun, FNP

## 2018-01-19 NOTE — Patient Instructions (Addendum)
Health Maintenance, Female Adopting a healthy lifestyle and getting preventive care can go a long way to promote health and wellness. Talk with your health care provider about what schedule of regular examinations is right for you. This is a good chance for you to check in with your provider about disease prevention and staying healthy. In between checkups, there are plenty of things you can do on your own. Experts have done a lot of research about which lifestyle changes and preventive measures are most likely to keep you healthy. Ask your health care provider for more information. Weight and diet Eat a healthy diet  Be sure to include plenty of vegetables, fruits, low-fat dairy products, and lean protein.  Do not eat a lot of foods high in solid fats, added sugars, or salt.  Get regular exercise. This is one of the most important things you can do for your health. ? Most adults should exercise for at least 150 minutes each week. The exercise should increase your heart rate and make you sweat (moderate-intensity exercise). ? Most adults should also do strengthening exercises at least twice a week. This is in addition to the moderate-intensity exercise.  Maintain a healthy weight  Body mass index (BMI) is a measurement that can be used to identify possible weight problems. It estimates body fat based on height and weight. Your health care provider can help determine your BMI and help you achieve or maintain a healthy weight.  For females 20 years of age and older: ? A BMI below 18.5 is considered underweight. ? A BMI of 18.5 to 24.9 is normal. ? A BMI of 25 to 29.9 is considered overweight. ? A BMI of 30 and above is considered obese.  Watch levels of cholesterol and blood lipids  You should start having your blood tested for lipids and cholesterol at 60 years of age, then have this test every 5 years.  You may need to have your cholesterol levels checked more often if: ? Your lipid or  cholesterol levels are high. ? You are older than 60 years of age. ? You are at high risk for heart disease.  Cancer screening Lung Cancer  Lung cancer screening is recommended for adults 55-80 years old who are at high risk for lung cancer because of a history of smoking.  A yearly low-dose CT scan of the lungs is recommended for people who: ? Currently smoke. ? Have quit within the past 15 years. ? Have at least a 30-pack-year history of smoking. A pack year is smoking an average of one pack of cigarettes a day for 1 year.  Yearly screening should continue until it has been 15 years since you quit.  Yearly screening should stop if you develop a health problem that would prevent you from having lung cancer treatment.  Breast Cancer  Practice breast self-awareness. This means understanding how your breasts normally appear and feel.  It also means doing regular breast self-exams. Let your health care provider know about any changes, no matter how small.  If you are in your 20s or 30s, you should have a clinical breast exam (CBE) by a health care provider every 1-3 years as part of a regular health exam.  If you are 40 or older, have a CBE every year. Also consider having a breast X-ray (mammogram) every year.  If you have a family history of breast cancer, talk to your health care provider about genetic screening.  If you are at high risk   for breast cancer, talk to your health care provider about having an MRI and a mammogram every year.  Breast cancer gene (BRCA) assessment is recommended for women who have family members with BRCA-related cancers. BRCA-related cancers include: ? Breast. ? Ovarian. ? Tubal. ? Peritoneal cancers.  Results of the assessment will determine the need for genetic counseling and BRCA1 and BRCA2 testing.  Cervical Cancer Your health care provider may recommend that you be screened regularly for cancer of the pelvic organs (ovaries, uterus, and  vagina). This screening involves a pelvic examination, including checking for microscopic changes to the surface of your cervix (Pap test). You may be encouraged to have this screening done every 3 years, beginning at age 22.  For women ages 56-65, health care providers may recommend pelvic exams and Pap testing every 3 years, or they may recommend the Pap and pelvic exam, combined with testing for human papilloma virus (HPV), every 5 years. Some types of HPV increase your risk of cervical cancer. Testing for HPV may also be done on women of any age with unclear Pap test results.  Other health care providers may not recommend any screening for nonpregnant women who are considered low risk for pelvic cancer and who do not have symptoms. Ask your health care provider if a screening pelvic exam is right for you.  If you have had past treatment for cervical cancer or a condition that could lead to cancer, you need Pap tests and screening for cancer for at least 20 years after your treatment. If Pap tests have been discontinued, your risk factors (such as having a new sexual partner) need to be reassessed to determine if screening should resume. Some women have medical problems that increase the chance of getting cervical cancer. In these cases, your health care provider may recommend more frequent screening and Pap tests.  Colorectal Cancer  This type of cancer can be detected and often prevented.  Routine colorectal cancer screening usually begins at 60 years of age and continues through 60 years of age.  Your health care provider may recommend screening at an earlier age if you have risk factors for colon cancer.  Your health care provider may also recommend using home test kits to check for hidden blood in the stool.  A small camera at the end of a tube can be used to examine your colon directly (sigmoidoscopy or colonoscopy). This is done to check for the earliest forms of colorectal  cancer.  Routine screening usually begins at age 33.  Direct examination of the colon should be repeated every 5-10 years through 60 years of age. However, you may need to be screened more often if early forms of precancerous polyps or small growths are found.  Skin Cancer  Check your skin from head to toe regularly.  Tell your health care provider about any new moles or changes in moles, especially if there is a change in a mole's shape or color.  Also tell your health care provider if you have a mole that is larger than the size of a pencil eraser.  Always use sunscreen. Apply sunscreen liberally and repeatedly throughout the day.  Protect yourself by wearing long sleeves, pants, a wide-brimmed hat, and sunglasses whenever you are outside.  Heart disease, diabetes, and high blood pressure  High blood pressure causes heart disease and increases the risk of stroke. High blood pressure is more likely to develop in: ? People who have blood pressure in the high end of  the normal range (130-139/85-89 mm Hg). ? People who are overweight or obese. ? People who are African American.  If you are 21-29 years of age, have your blood pressure checked every 3-5 years. If you are 3 years of age or older, have your blood pressure checked every year. You should have your blood pressure measured twice-once when you are at a hospital or clinic, and once when you are not at a hospital or clinic. Record the average of the two measurements. To check your blood pressure when you are not at a hospital or clinic, you can use: ? An automated blood pressure machine at a pharmacy. ? A home blood pressure monitor.  If you are between 17 years and 37 years old, ask your health care provider if you should take aspirin to prevent strokes.  Have regular diabetes screenings. This involves taking a blood sample to check your fasting blood sugar level. ? If you are at a normal weight and have a low risk for diabetes,  have this test once every three years after 60 years of age. ? If you are overweight and have a high risk for diabetes, consider being tested at a younger age or more often. Preventing infection Hepatitis B  If you have a higher risk for hepatitis B, you should be screened for this virus. You are considered at high risk for hepatitis B if: ? You were born in a country where hepatitis B is common. Ask your health care provider which countries are considered high risk. ? Your parents were born in a high-risk country, and you have not been immunized against hepatitis B (hepatitis B vaccine). ? You have HIV or AIDS. ? You use needles to inject street drugs. ? You live with someone who has hepatitis B. ? You have had sex with someone who has hepatitis B. ? You get hemodialysis treatment. ? You take certain medicines for conditions, including cancer, organ transplantation, and autoimmune conditions.  Hepatitis C  Blood testing is recommended for: ? Everyone born from [birth year] through 1963. ? Anyone with known risk factors for hepatitis C.  Sexually transmitted infections (STIs)  You should be screened for sexually transmitted infections (STIs) including gonorrhea and chlamydia if: ? You are sexually active and are younger than 60 years of age. ? You are older than 60 years of age and your health care provider tells you that you are at risk for this type of infection. ? Your sexual activity has changed since you were last screened and you are at an increased risk for chlamydia or gonorrhea. Ask your health care provider if you are at risk.  If you do not have HIV, but are at risk, it may be recommended that you take a prescription medicine daily to prevent HIV infection. This is called pre-exposure prophylaxis (PrEP). You are considered at risk if: ? You are sexually active and do not regularly use condoms or know the HIV status of your partner(s). ? You take drugs by injection. ? You are  sexually active with a partner who has HIV.  Talk with your health care provider about whether you are at high risk of being infected with HIV. If you choose to begin PrEP, you should first be tested for HIV. You should then be tested every 3 months for as long as you are taking PrEP. Pregnancy  If you are premenopausal and you may become pregnant, ask your health care provider about preconception counseling.  If you may become  pregnant, take 400 to 800 micrograms (mcg) of folic acid every day.  If you want to prevent pregnancy, talk to your health care provider about birth control (contraception). Osteoporosis and menopause  Osteoporosis is a disease in which the bones lose minerals and strength with aging. This can result in serious bone fractures. Your risk for osteoporosis can be identified using a bone density scan.  If you are 25 years of age or older, or if you are at risk for osteoporosis and fractures, ask your health care provider if you should be screened.  Ask your health care provider whether you should take a calcium or vitamin D supplement to lower your risk for osteoporosis.  Menopause may have certain physical symptoms and risks.  Hormone replacement therapy may reduce some of these symptoms and risks. Talk to your health care provider about whether hormone replacement therapy is right for you. Follow these instructions at home:  Schedule regular health, dental, and eye exams.  Stay current with your immunizations.  Do not use any tobacco products including cigarettes, chewing tobacco, or electronic cigarettes.  If you are pregnant, do not drink alcohol.  If you are breastfeeding, limit how much and how often you drink alcohol.  Limit alcohol intake to no more than 1 drink per day for nonpregnant women. One drink equals 12 ounces of beer, 5 ounces of wine, or 1 ounces of hard liquor.  Do not use street drugs.  Do not share needles.  Ask your health care  provider for help if you need support or information about quitting drugs.  Tell your health care provider if you often feel depressed.  Tell your health care provider if you have ever been abused or do not feel safe at home. This information is not intended to replace advice given to you by your health care provider. Make sure you discuss any questions you have with your health care provider. Document Released: 03/03/2011 Document Revised: 01/24/2016 Document Reviewed: 05/22/2015 Elsevier Interactive Patient Education  2018 [place] Carcinoma Basal cell carcinoma is the most common form of skin cancer. It begins in the basal cells, which are at the bottom of the outer skin layer (epidermis). It occurs most often on parts of the body that are frequently exposed to the sun, such as:  Parts of the head, including the scalp or face.  Ears.  Neck.  Arms or legs.  Backs of the hands.  Basal cell carcinoma can almost always be cured. It rarely spreads to other areas of the body (metastasizes). Basal cell carcinoma may come back at the same location (recur), but it can be treated again if this occurs. What are the causes? This condition is usually caused by exposure to ultraviolet (UV) light. UV light may come from the sun or from tanning beds. Other causes include:  Exposure to arsenic.  Exposure to radiation.  Exposure to toxic tars and oils.  Certain genetic conditions, such as xeroderma pigmentosum.  What increases the risk? This condition is more likely to develop in:  People who are older than 60 years of age.  People who have fair skin (light complexion).  People who have blonde or red hair.  People who have blue, green, or gray eyes.  People who have childhood freckling.  People who have had sun exposure over long periods of time, especially during childhood.  People who have had repeated sunburns.  People who have a weakened immune  system.  People who have  been exposed to certain chemicals, such as tar, soot, and arsenic.  People who have chronic inflammatory conditions.  People who have chronic infections.  People who use tanning beds.  What are the signs or symptoms? The main symptom of this condition is a growth or lesion on the skin. The shape and color of the growth or lesion may vary. The five main types include:  An open sore that may remain open for 3 weeks or longer. The sore may bleed or crust. This type of lesion can be an early sign of basal cell carcinoma. Basal cell carcinoma often shows up as a sore that does not heal.  A reddish area that may crust, itch, or cause discomfort. This may occur on areas that are exposed to the sun. These patches might be easier to feel than to see.  A shiny or clear bump that is red, white, or pink. In people who have dark hair, the bump is often tan, black, or brown. These bumps can look like moles.  A pink growth with a raised border. The growth will have a crusted and indented area in the center. Small blood vessels may appear on the surface of the growth as it gets bigger.  A scarlike area that looks like shiny, stretched skin. The area may be white, yellow, or waxy. It often has irregular borders. This may be a sign of more aggressive basal cell carcinoma.  How is this diagnosed? This condition may be diagnosed with:  A physical exam.  Removal of a tissue sample to be examined under a microscope (biopsy).  How is this treated? Treatment for this condition involves removing the cancerous tissue. The method that is used for this depends on the type, size, location, and number of tumors. Possible treatments include:  Mohs surgery. In this procedure, the cancerous skin cells are removed layer by layer until all of the tumor has been removed.  Surgical removal (excision) of the tumor. This involves removing the entire tumor and a small amount of normal skin that  surrounds it.  Cryosurgery. This involves freezing the tumor with liquid nitrogen.  Plastic surgery. The tumor is removed, and healthy skin from another part of the body is used to cover the wound. This may be done for large tumors that are in areas where it is not possible to stretch the nearby skin to sew the edges of the wound together.  Radiation. This may be used for tumors on the face.  Photodynamic therapy. A chemical cream is applied to the skin, and light exposure is used to activate the chemical.  Electrodesiccation and curettage. This involves alternately scraping and burning the tumor while using an electric current to control bleeding.  Chemical treatments, such as imiquimod cream and interferon injections. These may be used to remove superficial tumors with minimal scarring.  Follow these instructions at home:  Avoid unprotected sun exposure.  Do self-exams as told by your health care provider. Look for new spots or changes in your skin.  Keep all follow-up visits as told by your health care provider. This is important. How is this prevented?  Avoid the sun when it is the strongest. This is usually between 10:00 a.m. and 4:00 p.m.  When you are out in the sun, use a sunscreen that has a sun protection factor (SPF) of at least 6.  Apply sunscreen at least 30 minutes before exposure to the sun.  Reapply sunscreen every 2-4 hours while you are outside. Also reapply  it after swimming and after excessive sweating.  Always wear hats, protective clothing, and UV-blocking sunglasses when you are outdoors.  Do not use tanning beds. Contact a health care provider if:  You notice any new spots or any changes in your skin.  You have had a basal cell carcinoma tumor removed and you notice a new growth in the same location. This information is not intended to replace advice given to you by your health care provider. Make sure you discuss any questions you have with your health  care provider. Document Released: 02/22/2003 Document Revised: 01/24/2016 Document Reviewed: 12/11/2014 Elsevier Interactive Patient Education  [employer].

## 2018-01-20 LAB — CBC WITH DIFFERENTIAL/PLATELET
BASOS ABS: 0 10*3/uL (ref 0.0–0.2)
Basos: 0 %
EOS (ABSOLUTE): 0.1 10*3/uL (ref 0.0–0.4)
Eos: 1 %
HEMOGLOBIN: 12.7 g/dL (ref 11.1–15.9)
Hematocrit: 37.8 % (ref 34.0–46.6)
IMMATURE GRANS (ABS): 0 10*3/uL (ref 0.0–0.1)
Immature Granulocytes: 0 %
LYMPHS: 24 %
Lymphocytes Absolute: 2.1 10*3/uL (ref 0.7–3.1)
MCH: 29.6 pg (ref 26.6–33.0)
MCHC: 33.6 g/dL (ref 31.5–35.7)
MCV: 88 fL (ref 79–97)
MONOCYTES: 7 %
Monocytes Absolute: 0.6 10*3/uL (ref 0.1–0.9)
Neutrophils Absolute: 5.9 10*3/uL (ref 1.4–7.0)
Neutrophils: 68 %
PLATELETS: 324 10*3/uL (ref 150–450)
RBC: 4.29 x10E6/uL (ref 3.77–5.28)
RDW: 13.6 % (ref 12.3–15.4)
WBC: 8.7 10*3/uL (ref 3.4–10.8)

## 2018-01-20 LAB — CMP14+EGFR
ALBUMIN: 4.7 g/dL (ref 3.6–4.8)
ALK PHOS: 92 IU/L (ref 39–117)
ALT: 8 IU/L (ref 0–32)
AST: 16 IU/L (ref 0–40)
Albumin/Globulin Ratio: 2.2 (ref 1.2–2.2)
BUN/Creatinine Ratio: 8 — ABNORMAL LOW (ref 12–28)
BUN: 5 mg/dL — AB (ref 8–27)
CHLORIDE: 107 mmol/L — AB (ref 96–106)
CO2: 24 mmol/L (ref 20–29)
Calcium: 9.4 mg/dL (ref 8.7–10.3)
Creatinine, Ser: 0.59 mg/dL (ref 0.57–1.00)
GFR calc Af Amer: 115 mL/min/{1.73_m2} (ref >59)
GFR calc non Af Amer: 100 mL/min/{1.73_m2} (ref >59)
GLUCOSE: 75 mg/dL (ref 65–99)
Globulin, Total: 2.1 g/dL (ref 1.5–4.5)
POTASSIUM: 4.5 mmol/L (ref 3.5–5.2)
Sodium: 147 mmol/L — ABNORMAL HIGH (ref 134–144)
Total Protein: 6.8 g/dL (ref 6.0–8.5)

## 2018-01-20 LAB — LIPID PANEL
CHOLESTEROL TOTAL: 191 mg/dL (ref 100–199)
Chol/HDL Ratio: 3.2 ratio (ref 0.0–4.4)
HDL: 59 mg/dL (ref >39)
LDL CALC: 104 mg/dL — AB (ref 0–99)
TRIGLYCERIDES: 139 mg/dL (ref 0–149)
VLDL CHOLESTEROL CAL: 28 mg/dL (ref 5–40)

## 2018-01-20 LAB — TSH: TSH: 2.32 u[IU]/mL (ref 0.450–4.500)

## 2018-01-21 ENCOUNTER — Encounter: Payer: Self-pay | Admitting: *Deleted

## 2018-03-06 LAB — COLOGUARD: Cologuard: NEGATIVE

## 2018-07-02 ENCOUNTER — Encounter: Payer: Self-pay | Admitting: Pediatrics

## 2018-07-02 ENCOUNTER — Ambulatory Visit (INDEPENDENT_AMBULATORY_CARE_PROVIDER_SITE_OTHER): Payer: Managed Care, Other (non HMO) | Admitting: Pediatrics

## 2018-07-02 VITALS — BP 123/78 | HR 87 | Temp 98.8°F | Ht 62.0 in | Wt 99.0 lb

## 2018-07-02 DIAGNOSIS — R221 Localized swelling, mass and lump, neck: Secondary | ICD-10-CM

## 2018-07-02 DIAGNOSIS — R591 Generalized enlarged lymph nodes: Secondary | ICD-10-CM | POA: Diagnosis not present

## 2018-07-02 DIAGNOSIS — F40240 Claustrophobia: Secondary | ICD-10-CM | POA: Diagnosis not present

## 2018-07-02 MED ORDER — ALPRAZOLAM 0.5 MG PO TBDP: ORAL_TABLET | ORAL | 0 refills | Status: DC

## 2018-07-02 NOTE — Progress Notes (Signed)
  Subjective:   Patient ID: Erika Turner, female    DOB: Apr 12, 1956, 62 y.o.   MRN: [id number] CC: Swollen lymph nodes in neck, pain underneath right arm (lymph node swelling began about a week ago, pain underneath arm just 2-3 days) and Back Pain (right shoulder blade area x 2-3 weeks, resident at work pinched her in same area)  HPI: Erika Turner is a 62 y.o. female   Recently got flu shot at work.  Nurse they recommended she be evaluated by her pcp given enlarged lymph nodes.  Has otherwise been feeling well other than lymph nodes as above.  No fevers, no weight changes.  Appetite has been fine, says she usually does not eat very much.  She does have a long smoking history.  No rash or skin changes.  Relevant past medical, surgical, family and social history reviewed. Allergies and medications reviewed and updated. Social History   Tobacco Use  Smoking Status Current Every Day Smoker  . Packs/day: 1.00  . Types: Cigarettes  . Start date: 11/02/1978  Smokeless Tobacco Never Used   ROS: Per HPI   Objective:    BP 123/78   Pulse 87   Temp 98.8 F (37.1 C) (Oral)   Ht '5\' 2"'$  (1.575 m)   Wt 99 lb (44.9 kg)   BMI 18.11 kg/m   Wt Readings from Last 3 Encounters:  07/02/18 99 lb (44.9 kg)  01/19/18 100 lb 9.6 oz (45.6 kg)  03/11/17 96 lb (43.5 kg)    Gen: NAD, alert, cooperative with exam, NCAT EYES: EOMI, no conjunctival injection, or no icterus ENT:  TMs pearly gray b/l, OP without erythema LYMPH: Left supra clavicular apprx 1.5 cm LN, right anterior cervical approximately 1 cm lymph node.  No redness or tenderness.  No lymph nodes palpable bilateral axilla, inguinal areas CV: NRRR, normal S1/S2, no murmur, distal pulses 2+ b/l Resp: CTABL, no wheezes, normal WOB Abd: +BS, soft, NTND. no guarding or organomegaly Ext: No edema, warm Neuro: Alert and oriented MSK: Mildly tender to palpation soft tissue medial to right shoulder blade.  Assessment & Plan:  Erika Turner was seen today for  swollen lymph nodes in neck, pain underneath right arm and back pain.  With new supraclavicular lymph node, will get blood work, CT scan.  Patient gets very nervous and close basis.  Will give prescription for Xanax to take as needed.  She is to have someone else drive her to and from the scan which she agrees to.  Diagnoses and all orders for this visit:  Claustrophobia -     ALPRAZolam (NIRAVAM) 0.5 MG dissolvable tablet; Take 1 tab before upcoming scan, repeat in 10 min if needed  Localized swelling, mass or lump of neck -     CT Chest Wo Contrast; Future  Lymphadenopathy -     CMP14+EGFR -     CBC with Differential/Platelet   Follow up plan: Return in about 2 weeks (around 07/16/2018). Assunta Found, MD [place]

## 2018-07-04 LAB — CBC WITH DIFFERENTIAL/PLATELET
BASOS: 1 %
Basophils Absolute: 0.1 10*3/uL (ref 0.0–0.2)
EOS (ABSOLUTE): 0.1 10*3/uL (ref 0.0–0.4)
EOS: 1 %
Hematocrit: 37.3 % (ref 34.0–46.6)
Hemoglobin: 12.5 g/dL (ref 11.1–15.9)
IMMATURE GRANS (ABS): 0 10*3/uL (ref 0.0–0.1)
IMMATURE GRANULOCYTES: 0 %
LYMPHS: 22 %
Lymphocytes Absolute: 2.1 10*3/uL (ref 0.7–3.1)
MCH: 28.9 pg (ref 26.6–33.0)
MCHC: 33.5 g/dL (ref 31.5–35.7)
MCV: 86 fL (ref 79–97)
MONOCYTES: 9 %
Monocytes Absolute: 0.8 10*3/uL (ref 0.1–0.9)
NEUTROS PCT: 67 %
Neutrophils Absolute: 6.4 10*3/uL (ref 1.4–7.0)
PLATELETS: 407 10*3/uL (ref 150–450)
RBC: 4.33 x10E6/uL (ref 3.77–5.28)
RDW: 12.4 % (ref 12.3–15.4)
WBC: 9.4 10*3/uL (ref 3.4–10.8)

## 2018-07-04 LAB — CMP14+EGFR
ALT: 6 IU/L (ref 0–32)
AST: 10 IU/L (ref 0–40)
Albumin/Globulin Ratio: 1.9 (ref 1.2–2.2)
Albumin: 4.5 g/dL (ref 3.6–4.8)
Alkaline Phosphatase: 101 IU/L (ref 39–117)
BUN/Creatinine Ratio: 9 — ABNORMAL LOW (ref 12–28)
BUN: 6 mg/dL — AB (ref 8–27)
Bilirubin Total: <0.2 mg/dL (ref 0.0–1.2)
CALCIUM: 9.7 mg/dL (ref 8.7–10.3)
CO2: 23 mmol/L (ref 20–29)
CREATININE: 0.64 mg/dL (ref 0.57–1.00)
Chloride: 103 mmol/L (ref 96–106)
GFR calc Af Amer: 112 mL/min/{1.73_m2} (ref >59)
GFR, EST NON AFRICAN AMERICAN: 97 mL/min/{1.73_m2} (ref >59)
GLUCOSE: 84 mg/dL (ref 65–99)
Globulin, Total: 2.4 g/dL (ref 1.5–4.5)
POTASSIUM: 4.4 mmol/L (ref 3.5–5.2)
Sodium: 141 mmol/L (ref 134–144)
Total Protein: 6.9 g/dL (ref 6.0–8.5)

## 2018-07-12 ENCOUNTER — Telehealth: Payer: Self-pay | Admitting: Family

## 2018-07-12 NOTE — Telephone Encounter (Signed)
Spoke with pt - aware of meds and to take the xanax 37min to 1 hour before arrival for CT

## 2018-07-14 ENCOUNTER — Ambulatory Visit (HOSPITAL_COMMUNITY): Payer: Managed Care, Other (non HMO)

## 2018-07-21 ENCOUNTER — Encounter: Payer: Self-pay | Admitting: Pediatrics

## 2018-07-21 ENCOUNTER — Ambulatory Visit (INDEPENDENT_AMBULATORY_CARE_PROVIDER_SITE_OTHER): Payer: Managed Care, Other (non HMO) | Admitting: Pediatrics

## 2018-07-21 VITALS — BP 113/72 | HR 95 | Temp 98.3°F | Resp 16 | Ht 62.0 in | Wt 98.2 lb

## 2018-07-21 DIAGNOSIS — N3090 Cystitis, unspecified without hematuria: Secondary | ICD-10-CM

## 2018-07-21 DIAGNOSIS — R399 Unspecified symptoms and signs involving the genitourinary system: Secondary | ICD-10-CM

## 2018-07-21 DIAGNOSIS — R0989 Other specified symptoms and signs involving the circulatory and respiratory systems: Secondary | ICD-10-CM | POA: Diagnosis not present

## 2018-07-21 DIAGNOSIS — R221 Localized swelling, mass and lump, neck: Secondary | ICD-10-CM

## 2018-07-21 LAB — MICROSCOPIC EXAMINATION: Renal Epithel, UA: NONE SEEN /hpf

## 2018-07-21 LAB — URINALYSIS, COMPLETE
BILIRUBIN UA: NEGATIVE
GLUCOSE, UA: NEGATIVE
KETONES UA: NEGATIVE
Leukocytes, UA: NEGATIVE
Nitrite, UA: NEGATIVE
PH UA: 6.5 (ref 5.0–7.5)
PROTEIN UA: NEGATIVE
Specific Gravity, UA: <1.005 — ABNORMAL LOW (ref 1.005–1.030)
UUROB: 0.2 mg/dL (ref 0.2–1.0)

## 2018-07-21 MED ORDER — NITROFURANTOIN MONOHYD MACRO 100 MG PO CAPS: 100.0000 mg | ORAL_CAPSULE | Freq: Two times a day (BID) | ORAL | 0 refills | Status: AC

## 2018-07-21 NOTE — Progress Notes (Signed)
  Subjective:   Patient ID: Erika Turner, female    DOB: [birth year], 62 y.o.   MRN: [id number] CC: Adenopathy (2 week recheck); Back Pain; and Trouble swallowing (Foods)  HPI: Erika Turner is a 62 y.o. female   Seen 3 weeks ago for supraclavicular adenopathy.  CT scan was ordered, patient's mother-in-law died the night before scheduled CT scan, needs to reschedule.   For last few days she has had some dysuria, bladder spasms/discomfort following emptying of her bladder.   At last visit she was having intermittent feeling of something getting caught her throat, trouble swallowing.  Is not there all the time.  For last couple weeks she has struggled to swallow solids.  Has to drink a large amount of fluids in order to wash anything down.  Limiting what she is eating because she is having trouble swallowing.  Drinking is fine.  Macaroni and cheese gets stuck, meats, bread.  No fevers or night sweats.  She does sometimes have daytime hot flashes which is not new.  Relevant past medical, surgical, family and social history reviewed. Allergies and medications reviewed and updated. Social History   Tobacco Use  Smoking Status Current Every Day Smoker  . Packs/day: 1.00  . Types: Cigarettes  . Start date: 11/02/1978  Smokeless Tobacco Never Used   ROS: Per HPI   Objective:    BP 113/72   Pulse 95   Temp 98.3 F (36.8 C) (Oral)   Resp 16   Ht 5\' 2"  (1.575 m)   Wt 98 lb 3.2 oz (44.5 kg)   SpO2 98%   BMI 17.96 kg/m   Wt Readings from Last 3 Encounters:  07/21/18 98 lb 3.2 oz (44.5 kg)  07/02/18 99 lb (44.9 kg)  01/19/18 100 lb 9.6 oz (45.6 kg)    Gen: NAD, alert, cooperative with exam, NCAT EYES: EOMI, no conjunctival injection, or no icterus ENT:  TMs pearly gray b/l, OP without erythema LYMPH: apprx 1.5 L supraclavicular node, apprx 1cm easily mobile R supraclavicular node. 1cm  R sided cervical node at angle of the jaw.  CV: NRRR, normal S1/S2, no murmur, distal pulses 2+  b/l Resp: CTABL, no wheezes, normal WOB Abd: +BS, soft, NTND. no guarding or organomegaly Ext: No edema, warm Neuro: Alert and oriented MSK: normal muscle bulk  Assessment & Plan:  Maureena was seen today for adenopathy, back pain and trouble swallowing.  Diagnoses and all orders for this visit:  Cystitis Given symptoms, hematuria, will go ahead and treat with below.  We will follow-up culture. -     nitrofurantoin, macrocrystal-monohydrate, (MACROBID) 100 MG capsule; Take 1 capsule (100 mg total) by mouth 2 (two) times daily for 5 days.  UTI symptoms -     Urinalysis, Complete -     Urine Culture; Future -     Urine Culture  Localized swelling, mass or lump of neck Missed original appointment, will reschedule. -     CT Chest Wo Contrast  Globus sensation Refer to GI.  Appetite has been down.  Able to eat minimally because of symptoms.    Follow up plan: Return in about 3 weeks (around 08/11/2018). Erika Found, MD [place]

## 2018-07-22 LAB — URINE CULTURE: Organism ID, Bacteria: NO GROWTH

## 2018-07-26 ENCOUNTER — Encounter: Payer: Self-pay | Admitting: Gastroenterology

## 2018-08-10 ENCOUNTER — Ambulatory Visit (INDEPENDENT_AMBULATORY_CARE_PROVIDER_SITE_OTHER): Payer: Managed Care, Other (non HMO) | Admitting: Family

## 2018-08-10 ENCOUNTER — Encounter: Payer: Self-pay | Admitting: Family

## 2018-08-10 VITALS — BP 127/80 | HR 95 | Temp 98.0°F | Ht 62.0 in | Wt 95.0 lb

## 2018-08-10 DIAGNOSIS — F17200 Nicotine dependence, unspecified, uncomplicated: Secondary | ICD-10-CM | POA: Diagnosis not present

## 2018-08-10 DIAGNOSIS — R5383 Other fatigue: Secondary | ICD-10-CM | POA: Diagnosis not present

## 2018-08-10 DIAGNOSIS — R590 Localized enlarged lymph nodes: Secondary | ICD-10-CM

## 2018-08-10 DIAGNOSIS — J0110 Acute frontal sinusitis, unspecified: Secondary | ICD-10-CM

## 2018-08-10 DIAGNOSIS — R0989 Other specified symptoms and signs involving the circulatory and respiratory systems: Secondary | ICD-10-CM

## 2018-08-10 MED ORDER — AMOXICILLIN-POT CLAVULANATE 875-125 MG PO TABS: 1.0000 | ORAL_TABLET | Freq: Two times a day (BID) | ORAL | 0 refills | Status: DC

## 2018-08-10 NOTE — Progress Notes (Signed)
Subjective:    Patient ID: Erika Turner, female    DOB: [birth year], 62 y.o.   MRN: [id number]  Chief Complaint  Patient presents with  . follow up swollen glands   Pt presents to the office today to recheck lymphadenopathy. She has lymph node swelling supraclavicular and right arm. She was seen on 07/02/18 and 07/21/18. CT chest was ordered, but states she had a death in the family and it was rescheduled 08/12/18.   She is also complaining of globus sensation and a referral to GI was made. She states her appt is not until February. She had a normal CBC and CMP.   She states she just does not feel well and her symptoms are worsening.   She smokes a pack a day for 40 years.  Sinusitis  This is a new problem. The current episode started 1 to 4 weeks ago. The problem has been gradually worsening since onset. There has been no fever. The pain is moderate. Associated symptoms include coughing, ear pain, headaches, a hoarse voice, sinus pressure and swollen glands. Pertinent negatives include no congestion or sneezing. Past treatments include acetaminophen. The treatment provided mild relief.      Review of Systems  Constitutional: Positive for fatigue.  HENT: Positive for ear pain, hoarse voice, sinus pressure and trouble swallowing. Negative for congestion and sneezing.   Respiratory: Positive for cough.   Neurological: Positive for headaches.  All other systems reviewed and are negative.      Objective:   Physical Exam  Constitutional: She is oriented to person, place, and time. She appears well-developed and well-nourished. No distress.  HENT:  Head: Normocephalic and atraumatic.  Right Ear: External ear normal.  Left Ear: External ear normal. Tympanic membrane is erythematous (mildly).  Nose: Right sinus exhibits frontal sinus tenderness.  Mouth/Throat: Posterior oropharyngeal erythema present.  Eyes: Pupils are equal, round, and reactive to light.  Neck: Normal range of  motion. Neck supple. No thyromegaly present.  Cardiovascular: Normal rate, regular rhythm, normal heart sounds and intact distal pulses.  No murmur heard. Pulmonary/Chest: Effort normal. No respiratory distress. She has decreased breath sounds. She has no wheezes.  Intermittent nonproductive cough  Abdominal: Soft. Bowel sounds are normal. She exhibits no distension. There is no tenderness.  Musculoskeletal: Normal range of motion. She exhibits no edema or tenderness.  Lymphadenopathy:    She has cervical adenopathy (hard, nontender adenopathy).  Neurological: She is alert and oriented to person, place, and time. She has normal reflexes. No cranial nerve deficit.  Skin: Skin is warm and dry.  Psychiatric: She has a normal mood and affect. Her behavior is normal. Judgment and thought content normal.  Vitals reviewed.   BP 127/80   Pulse 95   Temp 98 F (36.7 C) (Oral)   Ht 5\' 2"  (1.575 m)   Wt 95 lb (43.1 kg)   BMI 17.38 kg/m      Assessment & Plan:  Erika Turner comes in today with chief complaint of follow up swollen glands   Diagnosis and orders addressed:  1. Cervical adenopathy Keep CT scan  2. Smoker Smoking cessation discussed  3. Globus sensation Keep GI referral - Called and she is on cancellation list to move appt up.   4. Fatigue, unspecified type   5. Acute frontal sinusitis, recurrence not specified - Take meds as prescribed - Use a cool mist humidifier  -Use saline nose sprays frequently -Force fluids -For any cough or congestion  Use plain Mucinex-  regular strength or max strength is fine -For fever or aces or pains- take tylenol or ibuprofen. -Throat lozenges if help - amoxicillin-clavulanate (AUGMENTIN) 875-125 MG tablet; Take 1 tablet by mouth 2 (two) times daily.  Dispense: 14 tablet; Refill: 0    Evelina Dun, FNP

## 2018-08-10 NOTE — Patient Instructions (Signed)
Lymphadenopathy Lymphadenopathy refers to swollen or enlarged lymph glands, also called lymph nodes. Lymph glands are part of your body's defense (immune) system, which protects the body from infections, germs, and diseases. Lymph glands are found in many locations in your body, including the neck, underarm, and groin. Many things can cause lymph glands to become enlarged. When your immune system responds to germs, such as viruses or bacteria, infection-fighting cells and fluid build up. This causes the glands to grow in size. Usually, this is not something to worry about. The swelling and any soreness often go away without treatment. However, swollen lymph glands can also be caused by a number of diseases. Your health care provider may do various tests to help determine the cause. If the cause of your swollen lymph glands cannot be found, it is important to monitor your condition to make sure the swelling goes away. Follow these instructions at home: Watch your condition for any changes. The following actions may help to lessen any discomfort you are feeling:  Get plenty of rest.  Take medicines only as directed by your health care provider. Your health care provider may recommend over-the-counter medicines for pain.  Apply moist heat compresses to the site of swollen lymph nodes as directed by your health care provider. This can help reduce any pain.  Check your lymph nodes daily for any changes.  Keep all follow-up visits as directed by your health care provider. This is important.  Contact a health care provider if:  Your lymph nodes are still swollen after 2 weeks.  Your swelling increases or spreads to other areas.  Your lymph nodes are hard, seem fixed to the skin, or are growing rapidly.  Your skin over the lymph nodes is red and inflamed.  You have a fever.  You have chills.  You have fatigue.  You develop a sore throat.  You have abdominal pain.  You have weight  loss.  You have night sweats. Get help right away if:  You notice fluid leaking from the area of the enlarged lymph node.  You have severe pain in any area of your body.  You have chest pain.  You have shortness of breath. This information is not intended to replace advice given to you by your health care provider. Make sure you discuss any questions you have with your health care provider. Document Released: 05/27/2008 Document Revised: 01/24/2016 Document Reviewed: 03/23/2014 Elsevier Interactive Patient Education  2018 [employer]  

## 2018-08-12 ENCOUNTER — Ambulatory Visit (HOSPITAL_COMMUNITY)
Admission: RE | Admit: 2018-08-12 | Discharge: 2018-08-12 | Disposition: A | Discharge status: Home / Self Care | Payer: Managed Care, Other (non HMO) | Source: Ambulatory Visit | Attending: Pediatrics | Admitting: Pediatrics

## 2018-08-12 DIAGNOSIS — R221 Localized swelling, mass and lump, neck: Secondary | ICD-10-CM | POA: Diagnosis present

## 2018-08-13 ENCOUNTER — Telehealth: Payer: Self-pay | Admitting: *Deleted

## 2018-08-13 DIAGNOSIS — R590 Localized enlarged lymph nodes: Secondary | ICD-10-CM

## 2018-08-13 DIAGNOSIS — K769 Liver disease, unspecified: Secondary | ICD-10-CM

## 2018-08-13 DIAGNOSIS — R911 Solitary pulmonary nodule: Secondary | ICD-10-CM

## 2018-08-13 NOTE — Telephone Encounter (Signed)
Pt called and notified of results. She has Oncologists appt 08/17/18.

## 2018-08-13 NOTE — Telephone Encounter (Signed)
Call report on chest CT Please review and advise

## 2018-08-17 ENCOUNTER — Other Ambulatory Visit: Payer: Self-pay

## 2018-08-17 ENCOUNTER — Encounter (HOSPITAL_COMMUNITY): Payer: Self-pay | Admitting: Hematology

## 2018-08-17 ENCOUNTER — Inpatient Hospital Stay (HOSPITAL_COMMUNITY): Payer: Managed Care, Other (non HMO) | Attending: Hematology | Admitting: Hematology

## 2018-08-17 ENCOUNTER — Inpatient Hospital Stay (HOSPITAL_COMMUNITY): Payer: Managed Care, Other (non HMO)

## 2018-08-17 VITALS — BP 122/61 | HR 93 | Temp 98.9°F | Resp 16 | Ht 62.0 in | Wt 94.4 lb

## 2018-08-17 DIAGNOSIS — R591 Generalized enlarged lymph nodes: Secondary | ICD-10-CM | POA: Insufficient documentation

## 2018-08-17 DIAGNOSIS — B370 Candidal stomatitis: Secondary | ICD-10-CM | POA: Diagnosis not present

## 2018-08-17 DIAGNOSIS — Z8052 Family history of malignant neoplasm of bladder: Secondary | ICD-10-CM | POA: Insufficient documentation

## 2018-08-17 DIAGNOSIS — Z90710 Acquired absence of both cervix and uterus: Secondary | ICD-10-CM | POA: Diagnosis not present

## 2018-08-17 DIAGNOSIS — Z79899 Other long term (current) drug therapy: Secondary | ICD-10-CM

## 2018-08-17 DIAGNOSIS — R918 Other nonspecific abnormal finding of lung field: Secondary | ICD-10-CM

## 2018-08-17 DIAGNOSIS — R1310 Dysphagia, unspecified: Secondary | ICD-10-CM | POA: Diagnosis not present

## 2018-08-17 DIAGNOSIS — F17210 Nicotine dependence, cigarettes, uncomplicated: Secondary | ICD-10-CM | POA: Insufficient documentation

## 2018-08-17 DIAGNOSIS — B3781 Candidal esophagitis: Secondary | ICD-10-CM

## 2018-08-17 LAB — COMPREHENSIVE METABOLIC PANEL
ALBUMIN: 4.2 g/dL (ref 3.5–5.0)
ALT: 8 U/L (ref 0–44)
AST: 14 U/L — ABNORMAL LOW (ref 15–41)
Alkaline Phosphatase: 89 U/L (ref 38–126)
Anion gap: 10 (ref 5–15)
BUN: 5 mg/dL — AB (ref 6–20)
CO2: 25 mmol/L (ref 22–32)
Calcium: 9.3 mg/dL (ref 8.9–10.3)
Chloride: 104 mmol/L (ref 98–111)
Creatinine, Ser: 0.54 mg/dL (ref 0.44–1.00)
GFR calc Af Amer: >60 mL/min (ref >60)
GFR calc non Af Amer: >60 mL/min (ref >60)
GLUCOSE: 110 mg/dL — AB (ref 70–99)
Potassium: 4 mmol/L (ref 3.5–5.1)
Sodium: 139 mmol/L (ref 135–145)
Total Bilirubin: 0.6 mg/dL (ref 0.3–1.2)
Total Protein: 7.7 g/dL (ref 6.5–8.1)

## 2018-08-17 LAB — CBC WITH DIFFERENTIAL/PLATELET
Abs Immature Granulocytes: 0.03 10*3/uL (ref 0.00–0.07)
Basophils Absolute: 0 10*3/uL (ref 0.0–0.1)
Basophils Relative: 0 %
Eosinophils Absolute: 0.2 10*3/uL (ref 0.0–0.5)
Eosinophils Relative: 1 %
HCT: 39.1 % (ref 36.0–46.0)
Hemoglobin: 12.2 g/dL (ref 12.0–15.0)
Immature Granulocytes: 0 %
Lymphocytes Relative: 16 %
Lymphs Abs: 1.7 10*3/uL (ref 0.7–4.0)
MCH: 28.2 pg (ref 26.0–34.0)
MCHC: 31.2 g/dL (ref 30.0–36.0)
MCV: 90.5 fL (ref 80.0–100.0)
Monocytes Absolute: 0.8 10*3/uL (ref 0.1–1.0)
Monocytes Relative: 7 %
Neutro Abs: 7.9 10*3/uL — ABNORMAL HIGH (ref 1.7–7.7)
Neutrophils Relative %: 76 %
Platelets: 386 10*3/uL (ref 150–400)
RBC: 4.32 MIL/uL (ref 3.87–5.11)
RDW: 12.3 % (ref 11.5–15.5)
WBC: 10.6 10*3/uL — ABNORMAL HIGH (ref 4.0–10.5)
nRBC: 0 % (ref 0.0–0.2)

## 2018-08-17 LAB — LACTATE DEHYDROGENASE: LDH: 193 U/L — ABNORMAL HIGH (ref 98–192)

## 2018-08-17 MED ORDER — FLUCONAZOLE 100 MG PO TABS: 100.0000 mg | ORAL_TABLET | Freq: Every day | ORAL | 0 refills | Status: DC

## 2018-08-17 MED ORDER — TRAMADOL HCL 50 MG PO TABS: 25.0000 mg | ORAL_TABLET | Freq: Four times a day (QID) | ORAL | 0 refills | Status: DC | PRN

## 2018-08-17 NOTE — Assessment & Plan Note (Addendum)
1.  Generalized lymphadenopathy: - Patient noticed lymphadenopathy in the neck region in October 2019.  She also has night sweats but denies any fevers or significant weight loss. - Reports having difficulty swallowing for the past few weeks. - CT of the chest without contrast on 08/12/2018 shows bulky right hilar and mediastinal adenopathy with bilateral supraclavicular adenopathy, scattered small lung lesions nonspecific.  1.2 cm liver lesion incompletely visualized, may represent cyst, hemangioma. -Physical examination today shows bilateral supraclavicular adenopathy. - She also reports headaches, mainly frontal and retro-orbital.  They are also new. -She has oral thrush.  We will give Diflucan for 5 days. -I have recommended excisional biopsy of the right supraclavicular lymph node which is easily accessible for definitive diagnosis. -We will also order whole-body PET CT scan.  We will order MRI of the brain with and without contrast because of her headaches.  We will check her labs today including LDH level. -We will see her back after the biopsy and PET CT scan.  2.  Right shoulder pain: -She has pain in the right scapula, radiating into the axilla and upper arm for the last few weeks. - CT scan did not show any bony abnormalities.  We will follow-up on the PET CT scan. -We will start her on tramadol half tablet 1-2 times a day as needed.

## 2018-08-17 NOTE — Patient Instructions (Signed)
[facility] at [facility] Discharge Instructions     Thank you for choosing [facility] at [facility] to provide your oncology and hematology care.  To afford each patient quality time with our provider, please arrive at least 15 minutes before your scheduled appointment time.   If you have a lab appointment with the Cancer Center please come in thru the  Main Entrance and check in at the main information desk  You need to re-schedule your appointment should you arrive 10 or more minutes late.  We strive to give you quality time with our providers, and arriving late affects you and other patients whose appointments are after yours.  Also, if you no show three or more times for appointments you may be dismissed from the clinic at the providers discretion.     Again, thank you for choosing [facility].  Our hope is that these requests will decrease the amount of time that you wait before being seen by our physicians.       _____________________________________________________________  Should you have questions after your visit to [facility], please contact our office at [phone] between the hours of 8:00 a.m. and 4:30 p.m.  Voicemails left after 4:00 p.m. will not be returned until the following business day.  For prescription refill requests, have your pharmacy contact our office and allow 72 hours.    Cancer Center Support Programs:   > Cancer Support Group  2nd Tuesday of the month 1pm-2pm, Journey Room    

## 2018-08-17 NOTE — Progress Notes (Signed)
CONSULT NOTE  Patient Care Team: Sharion Balloon, FNP as PCP - General (Family Medicine) Danie Binder, MD as Consulting Physician (Gastroenterology)  CHIEF COMPLAINTS/PURPOSE OF CONSULTATION: Lymphadenopathy    HISTORY OF PRESENTING ILLNESS:  Erika Turner 62 y.o. female is here because of lymphadenopathy. She had several small supraclavicular lymph nodes. She also had right scapular pain. She is unable to swallow solids and has resulted to mostly liquids and soft foods only. She has new on set of headaches and right eye pain with no vision loss. She has SOB with resting that has increased over the past [redacted] weeks along with fevers and chills. She denies recent chest pain on exertion, pre-syncopal episodes, or palpitations.She had not noticed any recent bleeding such as epistaxis, hematuria or hematochezia. Denies any new lumps present.  She smokes 1 pack per day for the past 40 years. 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 She is currently working a full time job as Secretary/administrator. She has been unable to work this week due to her increasing pain.  She had no prior history or diagnosis of cancer. Her age appropriate screening programs are up-to-date. She has a family history of father with lung cancer and a brother with bladder cancer.   MEDICAL HISTORY:  Past Medical History:  Diagnosis Date  . GAD (generalized anxiety disorder) 01/21/2017  . Lymphadenopathy   . Underweight 01/21/2017    SURGICAL HISTORY: Past Surgical History:  Procedure Laterality Date  . ABDOMINAL HYSTERECTOMY    . CESAREAN SECTION     x 2    SOCIAL HISTORY: Social History   Socioeconomic History  . Marital status: Married    Spouse name: Not on file  . Number of children: 2  . Years of education: Not on file  . Highest education level: Not on file  Occupational History  . Occupation: housekeeper  Social Needs  . Financial resource strain: Not hard at all  . Food  insecurity:    Worry: Never true    Inability: Never true  . Transportation needs:    Medical: No    Non-medical: No  Tobacco Use  . Smoking status: Current Every Day Smoker    Packs/day: 1.00    Years: 40.00    Pack years: 40.00    Types: Cigarettes    Start date: 11/02/1978  . Smokeless tobacco: Never Used  Substance and Sexual Activity  . Alcohol use: No  . Drug use: No  . Sexual activity: Not on file  Lifestyle  . Physical activity:    Days per week: 0 days    Minutes per session: 0 min  . Stress: To some extent  Relationships  . Social connections:    Talks on phone: Twice a week    Gets together: Never    Attends religious service: Never    Active member of club or organization: No    Attends meetings of clubs or organizations: Never    Relationship status: Married  . Intimate partner violence:    Fear of current or ex partner: No    Emotionally abused: No    Physically abused: No    Forced sexual activity: No  Other Topics Concern  . Not on file  Social History Narrative   Housekeeping at [employer].    FAMILY HISTORY: Family History  Problem Relation Age of Onset  . Diabetes Mother   . Heart disease Mother  Pacemaker   . Cancer Father        Lung  . Cancer Brother        bladder then colon  . Down syndrome Brother   . Mental illness Daughter     ALLERGIES:  is allergic to biaxin [clarithromycin].  MEDICATIONS:  Current Outpatient Medications  Medication Sig Dispense Refill  . diphenhydramine-acetaminophen (TYLENOL PM) 25-500 MG TABS tablet Take 1 tablet by mouth at bedtime as needed.    Marland Kitchen escitalopram (LEXAPRO) 10 MG tablet Take 1 tablet (10 mg total) by mouth daily. 90 tablet 3  . fluconazole (DIFLUCAN) 100 MG tablet Take 1 tablet (100 mg total) by mouth daily. 5 tablet 0  . traMADol (ULTRAM) 50 MG tablet Take 0.5 tablets (25 mg total) by mouth every 6 (six) hours as needed. 30 tablet 0   No current facility-administered medications  for this visit.     REVIEW OF SYSTEMS:   Constitutional: Denies fevers, chills or abnormal night sweats Eyes: Denies blurriness of vision, double vision or watery eyes Ears, nose, mouth, throat, and face: Denies mucositis or sore throat Respiratory: Denies cough, dyspnea or wheezes Cardiovascular: Denies palpitation, chest discomfort or lower extremity swelling Gastrointestinal:  Denies nausea, heartburn or change in bowel habits Skin: Denies abnormal skin rashes Lymphatics: Supraclavicular lymph nodes.  Neurological:Denies numbness, tingling or new weaknesses Behavioral/Psych: Mood is stable, no new changes  All other systems were reviewed with the patient and are negative.  PHYSICAL EXAMINATION: ECOG PERFORMANCE STATUS: 1 - Symptomatic but completely ambulatory  Vitals:   08/17/18 1311  BP: 122/61  Pulse: 93  Resp: 16  Temp: 98.9 F (37.2 C)  SpO2: 98%   Filed Weights   08/17/18 1311  Weight: 94 lb 6.4 oz (42.8 kg)    GENERAL:alert, no distress and comfortable SKIN: skin color, texture, turgor are normal, no rashes or significant lesions EYES: normal, conjunctiva are pink and non-injected, sclera clear OROPHARYNX:no exudate, no erythema and lips, buccal mucosa, and tongue normal  NECK: supple, thyroid normal size, non-tender, without nodularity LYMPH:  Supraclavicular lymph nodes.  LUNGS: clear to auscultation and percussion with normal breathing effort HEART: regular rate & rhythm and no murmurs and no lower extremity edema ABDOMEN:abdomen soft, non-tender and normal bowel sounds Musculoskeletal:no cyanosis of digits and no clubbing  PSYCH: alert & oriented x 3 with fluent speech NEURO: no focal motor/sensory deficits  LABORATORY DATA:  I have reviewed the data as listed Recent Results (from the past 2160 hour(s))  CMP14+EGFR     Status: Abnormal   Collection Time: 07/02/18  6:47 PM  Result Value Ref Range   Glucose 84 65 - 99 mg/dL   BUN 6 (L) 8 - 27 mg/dL    Creatinine, Ser 0.64 0.57 - 1.00 mg/dL   GFR calc non Af Amer 97 >59 mL/min/1.73   GFR calc Af Amer 112 >59 mL/min/1.73   BUN/Creatinine Ratio 9 (L) 12 - 28   Sodium 141 134 - 144 mmol/L   Potassium 4.4 3.5 - 5.2 mmol/L   Chloride 103 96 - 106 mmol/L   CO2 23 20 - 29 mmol/L   Calcium 9.7 8.7 - 10.3 mg/dL   Total Protein 6.9 6.0 - 8.5 g/dL   Albumin 4.5 3.6 - 4.8 g/dL   Globulin, Total 2.4 1.5 - 4.5 g/dL   Albumin/Globulin Ratio 1.9 1.2 - 2.2   Bilirubin Total <0.2 0.0 - 1.2 mg/dL   Alkaline Phosphatase 101 39 - 117 IU/L   AST 10  0 - 40 IU/L   ALT 6 0 - 32 IU/L  CBC with Differential/Platelet     Status: None   Collection Time: 07/02/18  6:47 PM  Result Value Ref Range   WBC 9.4 3.4 - 10.8 x10E3/uL   RBC 4.33 3.77 - 5.28 x10E6/uL   Hemoglobin 12.5 11.1 - 15.9 g/dL   Hematocrit 37.3 34.0 - 46.6 %   MCV 86 79 - 97 fL   MCH 28.9 26.6 - 33.0 pg   MCHC 33.5 31.5 - 35.7 g/dL   RDW 12.4 12.3 - 15.4 %   Platelets 407 150 - 450 x10E3/uL   Neutrophils 67 Not Estab. %   Lymphs 22 Not Estab. %   Monocytes 9 Not Estab. %   Eos 1 Not Estab. %   Basos 1 Not Estab. %   Neutrophils Absolute 6.4 1.4 - 7.0 x10E3/uL   Lymphocytes Absolute 2.1 0.7 - 3.1 x10E3/uL   Monocytes Absolute 0.8 0.1 - 0.9 x10E3/uL   EOS (ABSOLUTE) 0.1 0.0 - 0.4 x10E3/uL   Basophils Absolute 0.1 0.0 - 0.2 x10E3/uL   Immature Granulocytes 0 Not Estab. %   Immature Grans (Abs) 0.0 0.0 - 0.1 x10E3/uL  Urinalysis, Complete     Status: Abnormal   Collection Time: 07/21/18  8:43 AM  Result Value Ref Range   Specific Gravity, UA <1.005 (L) 1.005 - 1.030   pH, UA 6.5 5.0 - 7.5   Color, UA Yellow Yellow   Appearance Ur Clear Clear   Leukocytes, UA Negative Negative   Protein, UA Negative Negative/Trace   Glucose, UA Negative Negative   Ketones, UA Negative Negative   RBC, UA Trace (A) Negative   Bilirubin, UA Negative Negative   Urobilinogen, Ur 0.2 0.2 - 1.0 mg/dL   Nitrite, UA Negative Negative   Microscopic  Examination See below:   Microscopic Examination     Status: Abnormal   Collection Time: 07/21/18  8:43 AM  Result Value Ref Range   WBC, UA 0-5 0 - 5 /hpf   RBC, UA 3-10 (A) 0 - 2 /hpf   Epithelial Cells (non renal) 0-10 0 - 10 /hpf   Renal Epithel, UA None seen None seen /hpf   Bacteria, UA Few None seen/Few  Urine Culture     Status: None   Collection Time: 07/21/18 10:29 AM  Result Value Ref Range   Urine Culture, Routine Final report    Organism ID, Bacteria No growth     RADIOGRAPHIC STUDIES: I have personally reviewed the radiological images as listed and agreed with the findings in the report. Ct Chest Wo Contrast  Result Date: 08/13/2018 CLINICAL DATA:  recheck lymphadenopathy. She has lymph node swelling supraclavicular and right arm. She was seen on 07/02/18 and 07/21/18. CT chest was ordered, but states she had a death in the family and it was rescheduled 08/12/18. She is also complaining of globus sensation and a referral to GI was made. She states her appt is not until February. She had a normal CBC and CMP. She states she just does not feel well and her symptoms are worsening. She smokes a pack a day for 40 years. EXAM: CT CHEST WITHOUT CONTRAST TECHNIQUE: Multidetector CT imaging of the chest was performed following the standard protocol without IV contrast. COMPARISON:  None. FINDINGS: Cardiovascular: Heart size normal. Small pericardial effusion or thickening. No thoracic aortic aneurysm. Scattered atheromatous calcifications in the coronary arteries, aortic arch and visualized proximal abdominal aorta. Mediastinum/Nodes: Bulky confluent right hilar,  subcarinal, right paratracheal, and AP window adenopathy. Supraclavicular adenopathy left greater than right. Lungs/Pleura: No pleural effusion. No pneumothorax. Small apical subpleural blebs. 1 cm region of consolidation/atelectasis medially in the right middle lobe. 6 mm angular nodule in the anterior basal segment right lower  lobe image 128/4. 5 mm angular nodule in the lateral lingula image 74/4. Upper Abdomen: 1.2 cm subcapsular low-attenuation lesion in hepatic segment 5, incompletely visualized. Visualized portions of adrenals unremarkable. No adenopathy or acute findings. 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 No fracture or worrisome bone lesion. IMPRESSION: 1. Bulky right hilar and mediastinal adenopathy and bilateral supraclavicular adenopathy, suggesting lymphoma or metastatic disease. The supraclavicular disease should be approachable for ultrasound-guided core biopsy if needed. 2. Scattered small lung lesions as above, nonspecific. PET/CT may be useful for further characterization. 3. 1.2 cm liver lesion, incompletely visualized, may represent cyst, hemangioma, primary or secondary neoplasm. Liver MR or PET-CT may be useful for further characterization. 4. Small pericardial effusion or thickening. 5. Coronary and Aortic Atherosclerosis (ICD10-170.0). These results will be called to the ordering clinician or representative by the Radiologist Assistant, and communication documented in the PACS or zVision Dashboard. Electronically Signed   By: Lucrezia Europe M.D.   On: 08/13/2018 08:34    ASSESSMENT & PLAN:  Lymphadenopathy 1.  Generalized lymphadenopathy: - Patient noticed lymphadenopathy in the neck region in October 2019.  She also has night sweats but denies any fevers or significant weight loss. - Reports having difficulty swallowing for the past few weeks. - CT of the chest without contrast on 08/12/2018 shows bulky right hilar and mediastinal adenopathy with bilateral supraclavicular adenopathy, scattered small lung lesions nonspecific.  1.2 cm liver lesion incompletely visualized, may represent cyst, hemangioma. -Physical examination today shows bilateral supraclavicular adenopathy. - She also reports headaches, mainly frontal and retro-orbital.  They are also new. -She has oral thrush.  We will give Diflucan  for 5 days. -I have recommended excisional biopsy of the right supraclavicular lymph node which is easily accessible for definitive diagnosis. -We will also order whole-body PET CT scan.  We will order MRI of the brain with and without contrast because of her headaches.  We will check her labs today including LDH level. -We will see her back after the biopsy and PET CT scan.  2.  Right shoulder pain: -She has pain in the right scapula, radiating into the axilla and upper arm for the last few weeks. - CT scan did not show any bony abnormalities.  We will follow-up on the PET CT scan. -We will start her on tramadol half tablet 1-2 times a day as needed.     All questions were answered. The patient knows to call the clinic with any problems, questions or concerns.   Derek Jack, MD

## 2018-08-18 ENCOUNTER — Encounter (HOSPITAL_COMMUNITY): Payer: Self-pay | Admitting: General Practice

## 2018-08-18 NOTE — Progress Notes (Signed)
[name] Psychosocial Distress Screening Clinical Social Work  Clinical Social Work was referred by distress screening protocol.  The patient scored a 7 on the Psychosocial Distress Thermometer which indicates moderate distress. Clinical Social Worker contacted patient by phone to assess for distress and other psychosocial needs. "Cant swallow or breathe, have to fight to force any food down my throat."  Has been put out of work for 2 weeks, needs FMLA paperwork completed.  Has anxiety from not knowing diagnosis and possible treatment needed.  Aware that planning process takes time.  Lives quite a distance from [facility], will bring FMLA paperwork in on next visit.    ONCBCN DISTRESS SCREENING 08/17/2018  Screening Type Initial Screening  Distress experienced in past week (1-10) 7  Practical problem type Work/school  Emotional problem type Nervousness/Anxiety;Adjusting to illness  Information Concerns Type Lack of info about diagnosis;Lack of info about treatment;Lack of info about complementary therapy choices  Physical Problem type Pain;Loss of appetitie;Mouth sores/swallowing;Nausea/vomiting    Clinical Social Worker follow up needed: No.  Patient aware of how to contact me if desired.    If yes, follow up plan:  Beverely Pace, [place], LCSW Clinical Social Worker Phone:  [phone]

## 2018-08-23 ENCOUNTER — Encounter (HOSPITAL_COMMUNITY)
Admission: RE | Admit: 2018-08-23 | Discharge: 2018-08-23 | Disposition: A | Discharge status: Home / Self Care | Payer: Managed Care, Other (non HMO) | Source: Ambulatory Visit | Attending: Nurse Practitioner | Admitting: Nurse Practitioner

## 2018-08-23 DIAGNOSIS — R591 Generalized enlarged lymph nodes: Secondary | ICD-10-CM | POA: Diagnosis present

## 2018-08-23 MED ORDER — FLUDEOXYGLUCOSE F - 18 (FDG) INJECTION
8.2400 | Freq: Once | INTRAVENOUS | Status: AC | PRN
  Administered 2018-08-23: 8.24 via INTRAVENOUS

## 2018-08-24 ENCOUNTER — Other Ambulatory Visit (HOSPITAL_COMMUNITY): Payer: Self-pay | Admitting: Nurse Practitioner

## 2018-08-24 ENCOUNTER — Ambulatory Visit (HOSPITAL_COMMUNITY)
Admission: RE | Admit: 2018-08-24 | Discharge: 2018-08-24 | Disposition: A | Discharge status: Home / Self Care | Payer: Managed Care, Other (non HMO) | Source: Ambulatory Visit | Attending: Nurse Practitioner | Admitting: Nurse Practitioner

## 2018-08-24 ENCOUNTER — Encounter: Payer: Self-pay | Admitting: General Surgery

## 2018-08-24 ENCOUNTER — Ambulatory Visit (INDEPENDENT_AMBULATORY_CARE_PROVIDER_SITE_OTHER): Payer: Managed Care, Other (non HMO) | Admitting: General Surgery

## 2018-08-24 VITALS — BP 122/87 | HR 98 | Temp 98.0°F | Resp 16 | Wt 91.6 lb

## 2018-08-24 DIAGNOSIS — R591 Generalized enlarged lymph nodes: Secondary | ICD-10-CM

## 2018-08-24 MED ORDER — GADOBUTROL 1 MMOL/ML IV SOLN: 3.0000 mL | Freq: Once | INTRAVENOUS | Status: DC | PRN

## 2018-08-24 NOTE — H&P (Signed)
Erika Turner; [id number]; Jun 23, 1956   HPI Patient is a 62 year old white female who was referred to my care by Dr. Delton Coombes for a lymph node biopsy.  Patient has generalized lymphadenopathy of unknown etiology.  Patient states that she has had some voice difficulties recently.  She is also suffering from dysphasia.  She has 2 out of 10 back pain.  She does feel weak.  She states her lymph node swelling is primarily in the neck region.  She denies any other lymphadenopathy. Past Medical History:  Diagnosis Date  . GAD (generalized anxiety disorder) 01/21/2017  . Lymphadenopathy   . Underweight 01/21/2017    Past Surgical History:  Procedure Laterality Date  . ABDOMINAL HYSTERECTOMY    . CESAREAN SECTION     x 2    Family History  Problem Relation Age of Onset  . Diabetes Mother   . Heart disease Mother        Pacemaker   . Cancer Father        Lung  . Cancer Brother        bladder then colon  . Down syndrome Brother   . Mental illness Daughter     Current Outpatient Medications on File Prior to Visit  Medication Sig Dispense Refill  . diphenhydramine-acetaminophen (TYLENOL PM) 25-500 MG TABS tablet Take 1 tablet by mouth at bedtime as needed.    Marland Kitchen escitalopram (LEXAPRO) 10 MG tablet Take 1 tablet (10 mg total) by mouth daily. 90 tablet 3  . fluconazole (DIFLUCAN) 100 MG tablet Take 1 tablet (100 mg total) by mouth daily. 5 tablet 0   No current facility-administered medications on file prior to visit.     Allergies  Allergen Reactions  . Biaxin [Clarithromycin] Nausea And Vomiting    Social History   Substance and Sexual Activity  Alcohol Use No    Social History   Tobacco Use  Smoking Status Current Every Day Smoker  . Packs/day: 1.00  . Years: 40.00  . Pack years: 40.00  . Types: Cigarettes  . Start date: 11/02/1978  Smokeless Tobacco Never Used    Review of Systems  Constitutional: Positive for malaise/fatigue.  HENT: Positive for sore throat.   Eyes:  Positive for pain.  Respiratory: Positive for cough, shortness of breath and wheezing.   Cardiovascular: Negative.   Gastrointestinal: Positive for heartburn.  Genitourinary: Negative.   Musculoskeletal: Positive for back pain, joint pain and neck pain.  Skin: Negative.   Neurological: Negative.   Endo/Heme/Allergies: Negative.   Psychiatric/Behavioral: Negative.     Objective   Vitals:   08/24/18 0942  BP: 122/87  Pulse: 98  Resp: 16  Temp: 98 F (36.7 C)    Physical Exam Vitals signs reviewed.  Constitutional:      Appearance: Normal appearance. She is not ill-appearing.     Comments: Cachectic.  HENT:     Head: Normocephalic and atraumatic.  Neck:     Musculoskeletal: Normal range of motion and neck supple.     Comments: Bilateral cervical lymphadenopathy, left greater than right with supraclavicular lymph nodes also present. Cardiovascular:     Rate and Rhythm: Normal rate and regular rhythm.     Heart sounds: Normal heart sounds. No murmur. No gallop.   Pulmonary:     Effort: Pulmonary effort is normal. No respiratory distress.     Breath sounds: Normal breath sounds. No stridor. No wheezing, rhonchi or rales.  Lymphadenopathy:     Cervical: Cervical adenopathy present.  Skin:  General: Skin is warm and dry.     Coloration: Skin is not pale.  Neurological:     Mental Status: She is alert and oriented to person, place, and time.    PET scan reviewed Dr. Tomie China notes reviewed Assessment  Generalized lymphadenopathy of unknown etiology Plan   Patient is scheduled for cervical lymph node biopsy on 08/30/2018.  The risks and benefits of the procedure including bleeding, infection, nerve injury, and the possibility of malignancy were fully explained to the patient, who gave informed consent.

## 2018-08-24 NOTE — Progress Notes (Signed)
Erika Turner; [id number]; 05-09-1956   HPI Patient is a 62 year old white female who was referred to my care by Dr. Delton Coombes for a lymph node biopsy.  Patient has generalized lymphadenopathy of unknown etiology.  Patient states that she has had some voice difficulties recently.  She is also suffering from dysphasia.  She has 2 out of 10 back pain.  She does feel weak.  She states her lymph node swelling is primarily in the neck region.  She denies any other lymphadenopathy. Past Medical History:  Diagnosis Date  . GAD (generalized anxiety disorder) 01/21/2017  . Lymphadenopathy   . Underweight 01/21/2017    Past Surgical History:  Procedure Laterality Date  . ABDOMINAL HYSTERECTOMY    . CESAREAN SECTION     x 2    Family History  Problem Relation Age of Onset  . Diabetes Mother   . Heart disease Mother        Pacemaker   . Cancer Father        Lung  . Cancer Brother        bladder then colon  . Down syndrome Brother   . Mental illness Daughter     Current Outpatient Medications on File Prior to Visit  Medication Sig Dispense Refill  . diphenhydramine-acetaminophen (TYLENOL PM) 25-500 MG TABS tablet Take 1 tablet by mouth at bedtime as needed.    Marland Kitchen escitalopram (LEXAPRO) 10 MG tablet Take 1 tablet (10 mg total) by mouth daily. 90 tablet 3  . fluconazole (DIFLUCAN) 100 MG tablet Take 1 tablet (100 mg total) by mouth daily. 5 tablet 0   No current facility-administered medications on file prior to visit.     Allergies  Allergen Reactions  . Biaxin [Clarithromycin] Nausea And Vomiting    Social History   Substance and Sexual Activity  Alcohol Use No    Social History   Tobacco Use  Smoking Status Current Every Day Smoker  . Packs/day: 1.00  . Years: 40.00  . Pack years: 40.00  . Types: Cigarettes  . Start date: 11/02/1978  Smokeless Tobacco Never Used    Review of Systems  Constitutional: Positive for malaise/fatigue.  HENT: Positive for sore throat.   Eyes:  Positive for pain.  Respiratory: Positive for cough, shortness of breath and wheezing.   Cardiovascular: Negative.   Gastrointestinal: Positive for heartburn.  Genitourinary: Negative.   Musculoskeletal: Positive for back pain, joint pain and neck pain.  Skin: Negative.   Neurological: Negative.   Endo/Heme/Allergies: Negative.   Psychiatric/Behavioral: Negative.     Objective   Vitals:   08/24/18 0942  BP: 122/87  Pulse: 98  Resp: 16  Temp: 98 F (36.7 C)    Physical Exam Vitals signs reviewed.  Constitutional:      Appearance: Normal appearance. She is not ill-appearing.     Comments: Cachectic.  HENT:     Head: Normocephalic and atraumatic.  Neck:     Musculoskeletal: Normal range of motion and neck supple.     Comments: Bilateral cervical lymphadenopathy, left greater than right with supraclavicular lymph nodes also present. Cardiovascular:     Rate and Rhythm: Normal rate and regular rhythm.     Heart sounds: Normal heart sounds. No murmur. No gallop.   Pulmonary:     Effort: Pulmonary effort is normal. No respiratory distress.     Breath sounds: Normal breath sounds. No stridor. No wheezing, rhonchi or rales.  Lymphadenopathy:     Cervical: Cervical adenopathy present.  Skin:  General: Skin is warm and dry.     Coloration: Skin is not pale.  Neurological:     Mental Status: She is alert and oriented to person, place, and time.    PET scan reviewed Dr. Tomie China notes reviewed Assessment  Generalized lymphadenopathy of unknown etiology Plan   Patient is scheduled for cervical lymph node biopsy on 08/30/2018.  The risks and benefits of the procedure including bleeding, infection, nerve injury, and the possibility of malignancy were fully explained to the patient, who gave informed consent.

## 2018-08-24 NOTE — Patient Instructions (Signed)
Open Lymph Node Biopsy An open lymph node biopsy is a procedure to remove a lymph node so that it can be examined under a microscope. Lymph nodes are part of the body's disease-fighting system (immune system). The immune system protects the body from infections, germs, and diseases. An open lymph node biopsy may be done to:  Look for germs or cancer cells in your lymph node.  Find out why your lymph node is swollen.  Find out more about a condition you have. Lymph nodes are found in many locations in the body. Biopsies are often done on lymph nodes in the head, neck, armpit, or groin. Tell a health care provider about:  Any allergies you have.  All medicines you are taking, including vitamins, herbs, eye drops, creams, and over-the-counter medicines.  Any problems you or family members have had with anesthetic medicines.  Any blood disorders you have.  Any surgeries you have had.  Any medical conditions you have or have had.  Whether you are pregnant or may be pregnant. What are the risks? Generally, this is a safe procedure. However, problems may occur, including:  Infection.  Bleeding.  Allergic reactions to medicines.  Damage to other structures or organs, such as a nerve.  Scarring. What happens before the procedure? Medicines Ask your health care provider about:  Changing or stopping your regular medicines. This is especially important if you are taking diabetes medicines or blood thinners.  Taking medicines such as aspirin and ibuprofen. These medicines can thin your blood. Do not take these medicines unless your health care provider tells you to take them.  Taking over-the-counter medicines, vitamins, herbs, and supplements. General instructions  Follow instructions from your health care provider about eating or drinking restrictions.  You may have an exam or testing.  You may have a blood or urine sample taken.  Plan to have someone take you home from the  hospital or clinic.  If you will be going home right after the procedure, plan to have someone with you for 24 hours.  Ask your health care provider how your surgical site will be marked or identified.  Ask your health care provider what steps will be taken to help prevent infection. These may include: ? Removing hair at the biopsy site. ? Washing skin with a germ-killing soap. ? Taking antibiotic medicine. What happens during the procedure?   An IV will be inserted into one of your veins.  You will be given one or more of the following: ? A medicine to help you relax (sedative). ? A medicine to numb the area (local anesthetic).  An incision will be made in the area where your lymph node is located.  Your lymph node will be removed.  Your incision will be closed with stitches (sutures).  An antibiotic ointment may be applied to your incision.  A bandage (dressing) will be placed over your incision. The procedure may vary among health care providers and hospitals. What happens after the procedure?  Your blood pressure, heart rate, breathing rate, and blood oxygen level will be monitored until you leave the hospital or clinic.  Do not drive for 24 hours if you were given a sedative during your procedure.  It is up to you to get the results of your procedure. Ask your health care provider, or the department that is doing the procedure, when your results will be ready. Summary  An open lymph node biopsy is a procedure to remove a lymph node so that  it can be checked for infections, germs, and disease.  Generally, this is a safe procedure. However, problems may occur, including bleeding, infection, allergic reaction to medicines, and damage to other structures or organs.  Follow your health care provider's instructions before the procedure. These may include changing or stopping some medicines and restricting what you eat and drink.  During the procedure, an incision will be  made in the area of the lymph node, the lymph node will be removed, and the incision will be closed with sutures.  You will be monitored after the procedure. Do not drive for 24 hours if you were given a sedative during your procedure. This information is not intended to replace advice given to you by your health care provider. Make sure you discuss any questions you have with your health care provider. Document Released: 09/14/2015 Document Revised: 03/25/2018 Document Reviewed: 03/25/2018 Elsevier Interactive Patient Education  2019 [employer].

## 2018-08-27 ENCOUNTER — Encounter (HOSPITAL_COMMUNITY): Payer: Self-pay

## 2018-08-27 ENCOUNTER — Encounter (HOSPITAL_COMMUNITY)
Admission: RE | Admit: 2018-08-27 | Discharge: 2018-08-27 | Disposition: A | Discharge status: Home / Self Care | Payer: Managed Care, Other (non HMO) | Source: Ambulatory Visit | Attending: General Surgery | Admitting: General Surgery

## 2018-08-30 ENCOUNTER — Encounter (HOSPITAL_COMMUNITY)
Admission: RE | Disposition: A | Discharge status: Home / Self Care | Payer: Self-pay | Source: Home / Self Care | Attending: General Surgery

## 2018-08-30 ENCOUNTER — Encounter (HOSPITAL_COMMUNITY): Payer: Self-pay | Admitting: *Deleted

## 2018-08-30 ENCOUNTER — Ambulatory Visit (HOSPITAL_COMMUNITY): Payer: Managed Care, Other (non HMO) | Admitting: Anesthesiology

## 2018-08-30 ENCOUNTER — Encounter: Payer: Self-pay | Admitting: Hematology

## 2018-08-30 ENCOUNTER — Ambulatory Visit (HOSPITAL_COMMUNITY)
Admission: RE | Admit: 2018-08-30 | Discharge: 2018-08-30 | Disposition: A | Discharge status: Home / Self Care | Payer: Managed Care, Other (non HMO) | Attending: General Surgery | Admitting: General Surgery

## 2018-08-30 DIAGNOSIS — C771 Secondary and unspecified malignant neoplasm of intrathoracic lymph nodes: Secondary | ICD-10-CM | POA: Diagnosis not present

## 2018-08-30 DIAGNOSIS — Z79899 Other long term (current) drug therapy: Secondary | ICD-10-CM | POA: Insufficient documentation

## 2018-08-30 DIAGNOSIS — R591 Generalized enlarged lymph nodes: Secondary | ICD-10-CM | POA: Diagnosis present

## 2018-08-30 DIAGNOSIS — F17210 Nicotine dependence, cigarettes, uncomplicated: Secondary | ICD-10-CM | POA: Insufficient documentation

## 2018-08-30 HISTORY — PX: LYMPH NODE BIOPSY: SHX201

## 2018-08-30 SURGERY — LYMPH NODE BIOPSY
Anesthesia: Monitor Anesthesia Care | Site: Neck | Laterality: Left

## 2018-08-30 MED ORDER — PROPOFOL 10 MG/ML IV BOLUS
INTRAVENOUS | Status: DC | PRN
  Administered 2018-08-30 (×7): 10 mg via INTRAVENOUS

## 2018-08-30 MED ORDER — PROPOFOL 10 MG/ML IV BOLUS
INTRAVENOUS | Status: AC
  Filled 2018-08-30: qty 20

## 2018-08-30 MED ORDER — ONDANSETRON HCL 4 MG/2ML IJ SOLN: 4.0000 mg | Freq: Once | INTRAMUSCULAR | Status: DC | PRN

## 2018-08-30 MED ORDER — MIDAZOLAM HCL 5 MG/5ML IJ SOLN
INTRAMUSCULAR | Status: DC | PRN
  Administered 2018-08-30: 1 mg via INTRAVENOUS
  Administered 2018-08-30: 2 mg via INTRAVENOUS

## 2018-08-30 MED ORDER — 0.9 % SODIUM CHLORIDE (POUR BTL) OPTIME
TOPICAL | Status: DC | PRN
  Administered 2018-08-30: 1000 mL

## 2018-08-30 MED ORDER — FENTANYL CITRATE (PF) 100 MCG/2ML IJ SOLN
INTRAMUSCULAR | Status: DC | PRN
  Administered 2018-08-30: 25 ug via INTRAVENOUS

## 2018-08-30 MED ORDER — LACTATED RINGERS IV SOLN
INTRAVENOUS | Status: DC | PRN
  Administered 2018-08-30: 10:00:00 via INTRAVENOUS

## 2018-08-30 MED ORDER — KETOROLAC TROMETHAMINE 30 MG/ML IJ SOLN
30.0000 mg | Freq: Once | INTRAMUSCULAR | Status: AC | PRN
  Administered 2018-08-30: 15 mg via INTRAVENOUS
  Filled 2018-08-30: qty 1

## 2018-08-30 MED ORDER — KETAMINE HCL 10 MG/ML IJ SOLN
INTRAMUSCULAR | Status: DC | PRN
  Administered 2018-08-30 (×2): 10 mg via INTRAVENOUS

## 2018-08-30 MED ORDER — BUPIVACAINE HCL (PF) 0.25 % IJ SOLN
INTRAMUSCULAR | Status: AC
  Filled 2018-08-30: qty 30

## 2018-08-30 MED ORDER — CHLORHEXIDINE GLUCONATE CLOTH 2 % EX PADS: 6.0000 | MEDICATED_PAD | Freq: Once | CUTANEOUS | Status: DC

## 2018-08-30 MED ORDER — LIDOCAINE HCL (PF) 1 % IJ SOLN
INTRAMUSCULAR | Status: DC | PRN
  Administered 2018-08-30: 1 mL

## 2018-08-30 MED ORDER — LACTATED RINGERS IV SOLN: INTRAVENOUS | Status: DC

## 2018-08-30 MED ORDER — MIDAZOLAM HCL 2 MG/2ML IJ SOLN
INTRAMUSCULAR | Status: AC
  Filled 2018-08-30: qty 2

## 2018-08-30 MED ORDER — BUPIVACAINE HCL (PF) 0.5 % IJ SOLN
INTRAMUSCULAR | Status: AC
  Filled 2018-08-30: qty 30

## 2018-08-30 MED ORDER — CEFAZOLIN SODIUM-DEXTROSE 2-4 GM/100ML-% IV SOLN
2.0000 g | INTRAVENOUS | Status: AC
  Administered 2018-08-30: 2 g via INTRAVENOUS
  Filled 2018-08-30: qty 100

## 2018-08-30 MED ORDER — KETAMINE HCL 50 MG/5ML IJ SOSY
PREFILLED_SYRINGE | INTRAMUSCULAR | Status: AC
  Filled 2018-08-30: qty 5

## 2018-08-30 MED ORDER — FENTANYL CITRATE (PF) 100 MCG/2ML IJ SOLN
INTRAMUSCULAR | Status: AC
  Filled 2018-08-30: qty 2

## 2018-08-30 MED ORDER — MEPERIDINE HCL 50 MG/ML IJ SOLN: 6.2500 mg | INTRAMUSCULAR | Status: DC | PRN

## 2018-08-30 MED ORDER — KETOROLAC TROMETHAMINE 30 MG/ML IJ SOLN: 15.0000 mg | Freq: Once | INTRAMUSCULAR | Status: DC

## 2018-08-30 MED ORDER — HYDROCODONE-ACETAMINOPHEN 7.5-325 MG PO TABS: 1.0000 | ORAL_TABLET | Freq: Once | ORAL | Status: DC | PRN

## 2018-08-30 MED ORDER — HYDROMORPHONE HCL 1 MG/ML IJ SOLN: 0.2500 mg | INTRAMUSCULAR | Status: DC | PRN

## 2018-08-30 MED ORDER — LIDOCAINE HCL (PF) 1 % IJ SOLN
INTRAMUSCULAR | Status: AC
  Filled 2018-08-30: qty 30

## 2018-08-30 SURGICAL SUPPLY — 26 items
ADH SKN CLS APL DERMABOND .7 (GAUZE/BANDAGES/DRESSINGS) ×1
CLOTH BEACON ORANGE TIMEOUT ST (SAFETY) ×3 IMPLANT
COVER LIGHT HANDLE STERIS (MISCELLANEOUS) ×6 IMPLANT
COVER WAND RF STERILE (DRAPES) ×2 IMPLANT
DECANTER SPIKE VIAL GLASS SM (MISCELLANEOUS) ×3 IMPLANT
DERMABOND ADVANCED (GAUZE/BANDAGES/DRESSINGS) ×2
DERMABOND ADVANCED .7 DNX12 (GAUZE/BANDAGES/DRESSINGS) IMPLANT
ELECT REM PT RETURN 9FT ADLT (ELECTROSURGICAL) ×3
ELECTRODE REM PT RTRN 9FT ADLT (ELECTROSURGICAL) ×1 IMPLANT
GLOVE BIOGEL PI IND STRL 7.0 (GLOVE) ×1 IMPLANT
GLOVE BIOGEL PI INDICATOR 7.0 (GLOVE) ×2
GLOVE SURG SS PI 7.5 STRL IVOR (GLOVE) ×3 IMPLANT
GOWN STRL REUS W/TWL LRG LVL3 (GOWN DISPOSABLE) ×6 IMPLANT
KIT TURNOVER KIT A (KITS) ×3 IMPLANT
MANIFOLD NEPTUNE II (INSTRUMENTS) ×3 IMPLANT
NDL HYPO 25X1 1.5 SAFETY (NEEDLE) ×1 IMPLANT
NEEDLE HYPO 25X1 1.5 SAFETY (NEEDLE) ×3 IMPLANT
NS IRRIG 1000ML POUR BTL (IV SOLUTION) ×3 IMPLANT
PACK MINOR (CUSTOM PROCEDURE TRAY) ×3 IMPLANT
PAD ARMBOARD 7.5X6 YLW CONV (MISCELLANEOUS) ×3 IMPLANT
SCRUB PCMX 4 OZ (MISCELLANEOUS) ×3 IMPLANT
SET BASIN LINEN APH (SET/KITS/TRAYS/PACK) ×3 IMPLANT
SPONGE INTESTINAL PEANUT (DISPOSABLE) ×3 IMPLANT
SUT MNCRL AB 4-0 PS2 18 (SUTURE) ×3 IMPLANT
SUT VICRYL AB 3 0 TIES (SUTURE) ×2 IMPLANT
SYR CONTROL 10ML LL (SYRINGE) ×3 IMPLANT

## 2018-08-30 NOTE — Op Note (Signed)
Patient:  Erika Turner  DOB:  December 25, 1955  MRN:  [id number]   Preop Diagnosis: Lymphadenopathy  Postop Diagnosis: Same  Procedure: Excisional lymph node biopsy, left supraclavicular  Surgeon: Aviva Signs, MD  Anes: MAC  Indications: Patient is a 62 year old white female with diffuse lymphadenopathy of unknown etiology.  Oncology has requested a biopsy of the left supraclavicular/cervical region.  The risks and benefits of the procedure including bleeding, infection, and nerve injury were fully explained to the patient, who gave informed consent.  Procedure note: The patient was placed in the supine position.  After monitored anesthesia care was given, the left neck and supraclavicular region were prepped and draped using usual sterile technique with DuraPrep.  Surgical site confirmation was performed.  1% Xylocaine was used for local anesthesia.  Multiple lymph nodes were palpable in the inferior left cervical chain and supraclavicular regions.  It was elected to proceed with the most obvious left supraclavicular lymph node.  An incision was made and the dissection was taken down to the lymph node.  The base of the lymph node was tied off with a 3-0 Vicryl tie.  The lymph node was removed and sent to pathology for flow cytometry.  No abnormal bleeding was noted at the end of the procedure.  The skin was closed using a 4-0 Monocryl subcuticular suture.  Dermabond was applied.  All tape and needle counts were correct at the end of the procedure.  Patient was awakened and transferred to PACU in stable condition.  Complications: None  EBL: Minimal  Specimen:  Left supraclavicular lymph node

## 2018-08-30 NOTE — Interval H&P Note (Signed)
History and Physical Interval Note:  08/30/2018 10:14 AM  Erika Turner  has presented today for surgery, with the diagnosis of generalized lymphadenopathy  The various methods of treatment have been discussed with the patient and family. After consideration of risks, benefits and other options for treatment, the patient has consented to  Procedure(s): CERVICAL LYMPH NODE BIOPSY (Left) as a surgical intervention .  The patient's history has been reviewed, patient examined, no change in status, stable for surgery.  I have reviewed the patient's chart and labs.  Questions were answered to the patient's satisfaction.     Aviva Signs

## 2018-08-30 NOTE — Discharge Instructions (Signed)
Open Lymph Node Biopsy, Care After This sheet gives you information about how to care for yourself after your procedure. Your health care provider may also give you more specific instructions. If you have problems or questions, contact your health care provider. What can I expect after the procedure? After the procedure, it is common to have:  Bruising.  Soreness.  Mild swelling. Follow these instructions at home: Medicines  Take over-the-counter and prescription medicines only as told by your health care provider.  If you were prescribed an antibiotic medicine, take it as told by your health care provider. Do not stop taking the antibiotic even if you start to feel better. Incision care   Follow instructions from your health care provider about how to take care of your incision. Make sure you: ? Wash your hands with soap and water before and after you change your bandage (dressing). If soap and water are not available, use hand sanitizer. ? Change your dressing as told by your health care provider. ? Leave stitches (sutures), skin glue, or adhesive strips in place. These skin closures may need to stay in place for 2 weeks or longer. If adhesive strip edges start to loosen and curl up, you may trim the loose edges. Do not remove adhesive strips completely unless your health care provider tells you to do that.  Check your incision area every day for signs of infection. Check for: ? More redness, swelling, or pain. ? Fluid or blood. ? Warmth. ? Pus or a bad smell. Driving  Do not drive for 24 hours if you were given a sedative during your procedure.  Do not drive or use heavy machinery while taking prescription pain medicine. General instructions  Return to your normal activities as told by your health care provider. Ask your health care provider what activities are safe for you.  Do not take baths, swim, or use a hot tub until your health care provider approves. Ask your health  care provider if you may take showers. You may only be allowed to take sponge baths.  Keep all follow-up visits as told by your health care provider. This is important. Contact a health care provider if:  You have more redness, swelling, or pain around your incision.  You have fluid or blood coming from your incision.  Your incision feels warm to the touch.  You have pus or a bad smell coming from your incision.  You have a fever.  You have pain or numbness that gets worse or lasts longer than a few days. Summary  After a lymph node biopsy, it is common to have bruising, soreness, and mild swelling.  Follow your health care provider's instructions about taking care of yourself at home. You will be told how to take medicines, take care of your incision, and check for infection.  Return to your normal activities as told by your health care provider. Ask your health care provider what activities are safe for you.  Contact a health care provider if you have more redness, swelling, or pain around your incision, you have a fever, or you have worsening pain or numbness. This information is not intended to replace advice given to you by your health care provider. Make sure you discuss any questions you have with your health care provider. Document Released: 09/14/2015 Document Revised: 03/25/2018 Document Reviewed: 03/25/2018 Elsevier Interactive Patient Education  2019 [employer].

## 2018-08-30 NOTE — Anesthesia Postprocedure Evaluation (Signed)
Anesthesia Post Note  Patient: Erika Turner  Procedure(s) Performed: CERVICAL LYMPH NODE BIOPSY (Left Neck)  Patient location during evaluation: PACU Anesthesia Type: MAC Level of consciousness: awake and alert and oriented Pain management: pain level controlled Vital Signs Assessment: post-procedure vital signs reviewed and stable Respiratory status: spontaneous breathing Cardiovascular status: blood pressure returned to baseline and stable Postop Assessment: no apparent nausea or vomiting Anesthetic complications: no     Last Vitals:  Vitals:   08/30/18 0944 08/30/18 1100  BP: 121/78 (P) 134/82  Pulse:  (P) 99  Resp:  (P) 20  Temp: 36.8 C (P) 36.8 C  SpO2: 96% (P) 94%    Last Pain:  Vitals:   08/30/18 0944  TempSrc: Oral  PainSc: 5                  Prisca Gearing

## 2018-08-30 NOTE — Anesthesia Preprocedure Evaluation (Signed)
Anesthesia Evaluation  Patient identified by MRN, date of birth, ID band Patient awake    Reviewed: Allergy & Precautions, H&P , NPO status , Patient's Chart, lab work & pertinent test results  Airway Mallampati: II  TM Distance: >3 FB Neck ROM: full    Dental no notable dental hx.    Pulmonary neg pulmonary ROS, Current Smoker,  Inspiratory restriction when deep breath is required to speak.  No reported expiratory distress.  Patient can subjectively breath better in LLD position    Pulmonary exam normal breath sounds clear to auscultation       Cardiovascular Exercise Tolerance: Good negative cardio ROS   Rhythm:regular Rate:Normal     Neuro/Psych PSYCHIATRIC DISORDERS Anxiety negative neurological ROS     GI/Hepatic Neg liver ROS, dyspagia   Endo/Other  negative endocrine ROS  Renal/GU negative Renal ROS  negative genitourinary   Musculoskeletal   Abdominal   Peds  Hematology negative hematology ROS (+)   Anesthesia Other Findings PET SCAN: 1. Bilateral supraclavicular hypermetabolic adenopathy  2. 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 Bulky right hilar and mediastinal adenopathy and bilateral supraclavicular adenopathy, suggesting lymphoma or metastatic  Reproductive/Obstetrics negative OB ROS                             Anesthesia Physical Anesthesia Plan  ASA: IV  Anesthesia Plan: MAC   Post-op Pain Management:    Induction:   PONV Risk Score and Plan:   Airway Management Planned:   Additional Equipment:   Intra-op Plan:   Post-operative Plan:   Informed Consent: I have reviewed the patients History and Physical, chart, labs and discussed the procedure including the risks, benefits and alternatives for the proposed anesthesia with the patient or authorized representative  who has indicated his/her understanding and acceptance.   Dental Advisory Given  Plan Discussed with: CRNA  Anesthesia Plan Comments: ([place] with general LMA as back up plan; ETT last resort back up plan.  Plan to maintain spontaneous respirations throughout procedure.)        Anesthesia Quick Evaluation

## 2018-08-30 NOTE — Transfer of Care (Signed)
Immediate Anesthesia Transfer of Care Note  Patient: Erika Turner  Procedure(s) Performed: CERVICAL LYMPH NODE BIOPSY (Left Neck)  Patient Location: PACU  Anesthesia Type:MAC  Level of Consciousness: awake, alert  and oriented  Airway & Oxygen Therapy: Patient Spontanous Breathing  Post-op Assessment: Report given to RN  Post vital signs: Reviewed and stable  Last Vitals:  Vitals Value Taken Time  BP    Temp    Pulse 94 08/30/2018 11:05 AM  Resp 16 08/30/2018 11:05 AM  SpO2 97 % 08/30/2018 11:05 AM  Vitals shown include unvalidated device data.  Last Pain:  Vitals:   08/30/18 0944  TempSrc: Oral  PainSc: 5       Patients Stated Pain Goal: 7 (07/62/26 3335)  Complications: No apparent anesthesia complications

## 2018-08-31 ENCOUNTER — Encounter (HOSPITAL_COMMUNITY): Payer: Self-pay | Admitting: General Surgery

## 2018-09-07 ENCOUNTER — Encounter (HOSPITAL_COMMUNITY): Payer: Self-pay | Admitting: Dietician

## 2018-09-07 ENCOUNTER — Encounter (HOSPITAL_COMMUNITY): Payer: Self-pay | Admitting: Hematology

## 2018-09-07 ENCOUNTER — Other Ambulatory Visit: Payer: Self-pay

## 2018-09-07 ENCOUNTER — Inpatient Hospital Stay (HOSPITAL_COMMUNITY): Payer: Managed Care, Other (non HMO) | Attending: Hematology | Admitting: Hematology

## 2018-09-07 ENCOUNTER — Inpatient Hospital Stay (HOSPITAL_COMMUNITY): Payer: Managed Care, Other (non HMO)

## 2018-09-07 VITALS — BP 120/70 | HR 98 | Temp 98.0°F | Resp 18 | Wt 88.0 lb

## 2018-09-07 DIAGNOSIS — Z791 Long term (current) use of non-steroidal anti-inflammatories (NSAID): Secondary | ICD-10-CM | POA: Insufficient documentation

## 2018-09-07 DIAGNOSIS — M25511 Pain in right shoulder: Secondary | ICD-10-CM | POA: Diagnosis not present

## 2018-09-07 DIAGNOSIS — K5900 Constipation, unspecified: Secondary | ICD-10-CM | POA: Diagnosis not present

## 2018-09-07 DIAGNOSIS — R1310 Dysphagia, unspecified: Secondary | ICD-10-CM | POA: Diagnosis not present

## 2018-09-07 DIAGNOSIS — Z79899 Other long term (current) drug therapy: Secondary | ICD-10-CM | POA: Diagnosis not present

## 2018-09-07 DIAGNOSIS — M542 Cervicalgia: Secondary | ICD-10-CM | POA: Diagnosis not present

## 2018-09-07 DIAGNOSIS — F17210 Nicotine dependence, cigarettes, uncomplicated: Secondary | ICD-10-CM

## 2018-09-07 DIAGNOSIS — R21 Rash and other nonspecific skin eruption: Secondary | ICD-10-CM | POA: Insufficient documentation

## 2018-09-07 DIAGNOSIS — R531 Weakness: Secondary | ICD-10-CM | POA: Diagnosis not present

## 2018-09-07 DIAGNOSIS — F40240 Claustrophobia: Secondary | ICD-10-CM | POA: Insufficient documentation

## 2018-09-07 DIAGNOSIS — R112 Nausea with vomiting, unspecified: Secondary | ICD-10-CM | POA: Diagnosis not present

## 2018-09-07 DIAGNOSIS — R062 Wheezing: Secondary | ICD-10-CM

## 2018-09-07 DIAGNOSIS — R072 Precordial pain: Secondary | ICD-10-CM | POA: Diagnosis not present

## 2018-09-07 DIAGNOSIS — J449 Chronic obstructive pulmonary disease, unspecified: Secondary | ICD-10-CM

## 2018-09-07 DIAGNOSIS — C3490 Malignant neoplasm of unspecified part of unspecified bronchus or lung: Secondary | ICD-10-CM | POA: Insufficient documentation

## 2018-09-07 DIAGNOSIS — R634 Abnormal weight loss: Secondary | ICD-10-CM | POA: Insufficient documentation

## 2018-09-07 LAB — CBC WITH DIFFERENTIAL/PLATELET
Abs Immature Granulocytes: 0.03 10*3/uL (ref 0.00–0.07)
Basophils Absolute: 0 10*3/uL (ref 0.0–0.1)
Basophils Relative: 0 %
Eosinophils Absolute: 0.1 10*3/uL (ref 0.0–0.5)
Eosinophils Relative: 1 %
HCT: 40.1 % (ref 36.0–46.0)
HEMOGLOBIN: 12.8 g/dL (ref 12.0–15.0)
Immature Granulocytes: 0 %
LYMPHS ABS: 1.3 10*3/uL (ref 0.7–4.0)
Lymphocytes Relative: 12 %
MCH: 27.5 pg (ref 26.0–34.0)
MCHC: 31.9 g/dL (ref 30.0–36.0)
MCV: 86.2 fL (ref 80.0–100.0)
Monocytes Absolute: 0.8 10*3/uL (ref 0.1–1.0)
Monocytes Relative: 8 %
Neutro Abs: 8.4 10*3/uL — ABNORMAL HIGH (ref 1.7–7.7)
Neutrophils Relative %: 79 %
Platelets: 407 10*3/uL — ABNORMAL HIGH (ref 150–400)
RBC: 4.65 MIL/uL (ref 3.87–5.11)
RDW: 12.5 % (ref 11.5–15.5)
WBC: 10.7 10*3/uL — ABNORMAL HIGH (ref 4.0–10.5)
nRBC: 0 % (ref 0.0–0.2)

## 2018-09-07 LAB — COMPREHENSIVE METABOLIC PANEL
ALBUMIN: 4.2 g/dL (ref 3.5–5.0)
ALT: 10 U/L (ref 0–44)
AST: 15 U/L (ref 15–41)
Alkaline Phosphatase: 79 U/L (ref 38–126)
Anion gap: 10 (ref 5–15)
BUN: 10 mg/dL (ref 6–20)
CO2: 30 mmol/L (ref 22–32)
Calcium: 9.8 mg/dL (ref 8.9–10.3)
Chloride: 96 mmol/L — ABNORMAL LOW (ref 98–111)
Creatinine, Ser: 0.47 mg/dL (ref 0.44–1.00)
GFR calc Af Amer: >60 mL/min (ref >60)
GFR calc non Af Amer: >60 mL/min (ref >60)
Glucose, Bld: 122 mg/dL — ABNORMAL HIGH (ref 70–99)
Potassium: 3.9 mmol/L (ref 3.5–5.1)
Sodium: 136 mmol/L (ref 135–145)
Total Bilirubin: 0.4 mg/dL (ref 0.3–1.2)
Total Protein: 7.5 g/dL (ref 6.5–8.1)

## 2018-09-07 LAB — MAGNESIUM: Magnesium: 2 mg/dL (ref 1.7–2.4)

## 2018-09-07 LAB — LACTATE DEHYDROGENASE: LDH: 176 U/L (ref 98–192)

## 2018-09-07 MED ORDER — IPRATROPIUM-ALBUTEROL 0.5-2.5 (3) MG/3ML IN SOLN
RESPIRATORY_TRACT | Status: AC
  Filled 2018-09-07: qty 3

## 2018-09-07 MED ORDER — IPRATROPIUM-ALBUTEROL 20-100 MCG/ACT IN AERS: 1.0000 | INHALATION_SPRAY | Freq: Four times a day (QID) | RESPIRATORY_TRACT | 2 refills | Status: AC

## 2018-09-07 MED ORDER — PROCHLORPERAZINE MALEATE 10 MG PO TABS: 10.0000 mg | ORAL_TABLET | Freq: Four times a day (QID) | ORAL | 0 refills | Status: AC | PRN

## 2018-09-07 MED ORDER — IPRATROPIUM-ALBUTEROL 0.5-2.5 (3) MG/3ML IN SOLN: 3.0000 mL | Freq: Once | RESPIRATORY_TRACT | Status: DC

## 2018-09-07 MED ORDER — HYDROCODONE-ACETAMINOPHEN 5-325 MG PO TABS: 1.0000 | ORAL_TABLET | Freq: Four times a day (QID) | ORAL | 0 refills | Status: AC | PRN

## 2018-09-07 MED ORDER — BREATHERITE COLL SPACER ADULT MISC: 1.0000 | Freq: Four times a day (QID) | 0 refills | Status: AC

## 2018-09-07 NOTE — Progress Notes (Signed)
Nutrition Assessment  Reason for Assessment: RN request  ASSESSMENT:  63 y/o female. Active smoker w/ 40 pack year hx. Recently developed supraclavicular lymphadenopathy and associated dysphagia. PCP ordered CT chest which showed adenopathy throughout thorax w/ f/u PET confirming hypermetabolic properties. Biopsy 12/30. + for metastatic adenocarcinoma.   Had impromptu appt with pt per nursing request. Unable to perform full chart review prior to seeing. Pt has severe dysphagia and her intake correspondingly has suffered.  Pt reports that she has been struggling with dysphagia, poor intake, and postprandial vomiting for approximately 3 weeks. She reports a constant globus sensation and says her dysphagia has progressed to the point where she struggles just w/ swallowing small pills. She says she is limited to liquids and very soft items, such as mashed potatoes. In fact, when asked what she has been living off of, she says "mashed potatoes". Beverage-wise, she drinks sweet tea, diet soda and water.   She has been trying to drink oral supplements, such as Boost. She says she initially tolerated these, but more recently, these have started causing significant stomach upset and emesis,  purportedly d/t their sweetness.   With all intake, liquid or solid, she reports erratic, abrupt episodes of vomiting. She reports feeling no different up until the moment she vomits. Because of this, she is scared to eat. She will take "a bite" and "then wait five minutes". She has early satiety. Her spouse offers a slightly different account, and says she typically keeps food down if she able to swallow it.   Wt wise, she is currently 88 lbs. She was 95 lbs 1 month ago. This is a significant loss of >8% bw in <1 month. Per chart review, her UBW appears to be 98-102 lbs which, prior to December, she had been stable at x5 years.   She says she is struggling with a few major symptoms: "If I can get my breathing, sleep  and swallowing under control, I could get strong enough to fight this cancer".   Of note, during our appt, pt had woefully apparent SOB. She would need to take a large breath in between sentences and use her accessory muscle when doing so. Per nursing, she actually had been worse initially which prompted administration of a breathing tx and a f/u check in with MD/NP prior to leaving office. Hard to believe this degree of SOB is also not impacting intake/increasing energy needs.   Wt Readings from Last 10 Encounters:  09/07/18 88 lb (39.9 kg)  08/24/18 91 lb 9.6 oz (41.5 kg)  08/17/18 94 lb 6.4 oz (42.8 kg)  08/10/18 95 lb (43.1 kg)  07/21/18 98 lb 3.2 oz (44.5 kg)  07/02/18 99 lb (44.9 kg)  01/19/18 100 lb 9.6 oz (45.6 kg)  03/11/17 96 lb (43.5 kg)  03/09/17 96 lb 9.6 oz (43.8 kg)  01/21/17 97 lb 12.8 oz (44.4 kg)   MEDICATIONS:  Has not yet begun any sort of treatment Supportive meds: Hydrocodone, compazine.   LABS:  WBC:10.7, Glu: 122  Recent Labs  Lab 09/07/18 1158  NA 136  K 3.9  CL 96*  CO2 30  BUN 10  CREATININE 0.47  CALCIUM 9.8  MG 2.0  GLUCOSE 122*    ANTHROPOMETRICS: Height:  Ht Readings from Last 1 Encounters:  08/17/18 '5\' 2"'$  (1.575 m)   Weight:  Wt Readings from Last 1 Encounters:  09/07/18 88 lb (39.9 kg)   BMI:  BMI Readings from Last 1 Encounters:  09/07/18 16.10 kg/m  UBW: Was 98-102 lbs x 5 years prior to December IBW: 50  ESTIMATED ENERGY NEEDS:  Kcal: 1600-1800 kcals (40-45 kcal/kg bw) Protein: 80-90g Pro (2-2.2g/kg bw) Fluid: >1.4 L fluid  NUTRITION - FOCUSED PHYSICAL EXAM:  Unable to perform today   NUTRITION DIAGNOSIS:  Severe malnutrition related to cancer and related dysphagia/postprandial vomiting as evidenced by loss of >8% bw x1 week and an estimated intake that has met </= to 50% of needs x3 weeks.   DOCUMENTATION CODES:  Severe malnutrition in acute (based on above parameters). This is likely to eventually become a  chronic dx  INTERVENTION:  RD first stressed the need to eat more than just "bites"-explained she cannot maintain this habit long term. RD urged her to push herself to eat more, and if she is unable to tolerate that, then a alternative intervention will be needed. NP was present during this discussion. It is difficult to determine if her episodes of vomiting are r/t to a worsening of globus sensation which cues her body to vomit and expel "occlusion" OR if she has "true" nausea/vomting. NP wrote order for oral antiemetic in case it is the former situation. Stressed she needs to premedicate PRIOR to meals  She will need to find a supplement she can tolerate until dysphagia improves. RD asked why she could tolerate the sweet tea, but not the Boost/ensure. RD unable to discern her reasoning. RD explained that clear oral supplements are typically sweeter, because they cannot rely on fat for Kcal provision and must instead use sugar. On the other hand, the non-clear supplements are high in fat and if she has "true" nausea/vomiting, this may worsen with intake of those.  RD provided patient with a large number of supplement samples, both clear and nonclear, as well as coupons. We discussed several supplements by name specifically. RD also reviewed the recipe for fortified milk-which, will not be as sweet.   RD provided handout titled "Increasing Calories and Protein". We reviewed the "Big Three" of cheese, Peanut butter and whole milk, which are some of the most common foods that are high in both kcals and protein. RD also reviewed concept of never eating a food by itself. She should add toppings, condiments, dressings, creams, syrups etc to her meals. For example, RD stressed adding copious amounts of gravy and butter to her mashed potatoes. Also recommended sour cream and high fat mayo. RD explained condiments are calorie dense and take up very little space in the stomach.   To meet her needs, it will be  essential that she makes use of supplements and condiments.   GOAL:  Oral intake to meet >90% of needs, weight stability  MONITOR:  Weight, oral intake, level of dysphagia/diet tolerance, treatment plan, supplement tolerance  Next Visit: 1/21- (next office visit)  Burtis Junes RD, LDN, [facility] Available Tues-Sat via Pager: [phone] 09/07/2018 2:07 PM

## 2018-09-07 NOTE — Assessment & Plan Note (Signed)
1.  Non-small cell lung cancer, adenocarcinoma: - Patient noticed lymphadenopathy in the neck region in October 2019.  She also has night sweats but denies any fevers or significant weight loss. - Difficulty swallowing for the past few weeks.  Drinking Ensure 1 to 2 cans/day. - CT of the chest without contrast on 08/12/2018 shows bulky right hilar and mediastinal adenopathy with bilateral supraclavicular adenopathy, scattered small lung lesions nonspecific.  1.2 cm liver lesion incompletely visualized, may represent cyst, hemangioma. -Physical exam reveals bilateral supraclavicular adenopathy. - She had a biopsy of the left supra clavicular lymph node on 08/30/2018 which showed metastatic poorly differentiated adenocarcinoma consistent with lung primary.  Tumor was strongly positive for CK7 and TTF-1.  Weak positivity for Napsin-A and MOC-31. - She is not taking in enough calories.  We will refer her for dietary consultation. - We discussed the results of the PET CT scan dated 08/23/2018.  This showed lymphadenopathy in the bilateral supraclavicular regions, mediastinum, subcarinal, right hilar more than left hilar.  No pulmonary pathology.  No subdiaphragmatic disease seen. -We discussed the results of the MRI of the brain without contrast dated 08/24/2018 which showed several small areas of nonspecific T2/flair hyperintensity in the cerebral white matter, some with mild T2 shine through on diffusion. -I have recommended CT scan of the brain with and without contrast.  Patient could not complete MRI with contrast because of claustrophobia. -I have also recommended consultation with radiation oncology.  I called and talked to Julian. - If she does not have brain metastatic disease, we could treat her as stage III (TXN3) with chemo and radiation. -If she does have brain metastatic disease, she will require whole brain radiation therapy. - We will also make referral to Drs. Jenkins/Bridges for port  placement.  2.  Right shoulder pain: -She has pain in the right scapula, radiating into the axilla and upper arm. -She has tried tramadol which did not help. -We will start her on hydrocodone 5 mg twice a day as needed.  3.  COPD: -She was short of breath in our office.  She had both inspiratory and expiratory rhonchi. -Shortness of breath improved after DuoNeb nebulizer.  We have given her prescription for Combivent inhaler.

## 2018-09-07 NOTE — Progress Notes (Signed)
[place], [place] [address]   CLINIC:  Medical Oncology/Hematology  PCP:  Sharion Balloon, [place] [address] [phone]   REASON FOR VISIT: Follow-up for adenocarcinoma of the lung  CURRENT THERAPY: work-up   INTERVAL HISTORY:  Ms. Vankirk 63 y.o. female returns for routine follow-up for adenocarcinoma of the lung. She is here today with her husband. She is having difficulty breathing. She is wheezing and is unable to complete activities due to her breathing. She is having problems eating due to the difficulty swallowing. She is having pain in her sternum and neck. Denies any diarrhea. Had not noticed any recent bleeding such as epistaxis, hematuria or hematochezia. Denies recent chest pain on exertion, pre-syncopal episodes, or palpitations. Denies any numbness or tingling in hands or feet. Denies any recent fevers, infections, or recent hospitalizations. She reports her appetite and energy level low.     REVIEW OF SYSTEMS:  Review of Systems  Constitutional: Positive for fatigue.  HENT:   Positive for trouble swallowing.   Respiratory: Positive for cough, shortness of breath and wheezing.   Gastrointestinal: Positive for nausea and vomiting.  Neurological: Positive for headaches.  All other systems reviewed and are negative.    PAST MEDICAL/SURGICAL HISTORY:  Past Medical History:  Diagnosis Date  . GAD (generalized anxiety disorder) 01/21/2017  . Lymphadenopathy   . Underweight 01/21/2017   Past Surgical History:  Procedure Laterality Date  . ABDOMINAL HYSTERECTOMY    . CESAREAN SECTION     x 2  . LYMPH NODE BIOPSY Left 08/30/2018   Procedure: CERVICAL LYMPH NODE BIOPSY;  Surgeon: Aviva Signs, MD;  Location: [facility];  Service: General;  Laterality: Left;     SOCIAL HISTORY:  Social History   Socioeconomic History  . Marital status: Married    Spouse name: Not on file  . Number of children:  2  . Years of education: Not on file  . Highest education level: Not on file  Occupational History  . Occupation: housekeeper  Social Needs  . Financial resource strain: Not hard at all  . Food insecurity:    Worry: Never true    Inability: Never true  . Transportation needs:    Medical: No    Non-medical: No  Tobacco Use  . Smoking status: Current Every Day Smoker    Packs/day: 1.00    Years: 40.00    Pack years: 40.00    Types: Cigarettes    Start date: 11/02/1978  . Smokeless tobacco: Never Used  Substance and Sexual Activity  . Alcohol use: No  . Drug use: No  . Sexual activity: Not on file  Lifestyle  . Physical activity:    Days per week: 0 days    Minutes per session: 0 min  . Stress: To some extent  Relationships  . Social connections:    Talks on phone: Twice a week    Gets together: Never    Attends religious service: Never    Active member of club or organization: No    Attends meetings of clubs or organizations: Never    Relationship status: Married  . Intimate partner violence:    Fear of current or ex partner: No    Emotionally abused: No    Physically abused: No    Forced sexual activity: No  Other Topics Concern  . Not on file  Social History Narrative   Housekeeping at [employer].  FAMILY HISTORY:  Family History  Problem Relation Age of Onset  . Diabetes Mother   . Heart disease Mother        Pacemaker   . Cancer Father        Lung  . Cancer Brother        bladder then colon  . Down syndrome Brother   . Mental illness Daughter     CURRENT MEDICATIONS:  Outpatient Encounter Medications as of 09/07/2018  Medication Sig  . diphenhydramine-acetaminophen (TYLENOL PM) 25-500 MG TABS tablet Take 1 tablet by mouth at bedtime.   Marland Kitchen escitalopram (LEXAPRO) 10 MG tablet Take 1 tablet (10 mg total) by mouth daily.  Marland Kitchen ibuprofen (ADVIL,MOTRIN) 200 MG tablet Take 200-400 mg by mouth 2 (two) times daily as needed for moderate pain.  .  Naphazoline-Glycerin (REDNESS RELIEF OP) Place 1 drop into both eyes daily.  Marland Kitchen HYDROcodone-acetaminophen (NORCO) 5-325 MG tablet Take 1 tablet by mouth every 6 (six) hours as needed for moderate pain.  . Ipratropium-Albuterol (COMBIVENT) 20-100 MCG/ACT AERS respimat Inhale 1 puff into the lungs every 6 (six) hours.  . prochlorperazine (COMPAZINE) 10 MG tablet Take 1 tablet (10 mg total) by mouth every 6 (six) hours as needed for nausea or vomiting.  Marland Kitchen Spacer/Aero-Holding Chambers (BREATHERITE COLL SPACER ADULT) MISC 1 Device by Does not apply route every 6 (six) hours.  . [DISCONTINUED] Liniments (SALONPAS PAIN RELIEF PATCH EX) Apply 1 patch topically daily as needed (pain).  . [DISCONTINUED] Menthol, Topical Analgesic, (BIOFREEZE EX) Apply 1 application topically daily as needed (shoulder/neck pain).   Facility-Administered Encounter Medications as of 09/07/2018  Medication  . ipratropium-albuterol (DUONEB) 0.5-2.5 (3) MG/3ML nebulizer solution 3 mL    ALLERGIES:  Allergies  Allergen Reactions  . Biaxin [Clarithromycin] Nausea And Vomiting     PHYSICAL EXAM:  ECOG Performance status: 1  Vitals:   09/07/18 1000  BP: 120/70  Pulse: 98  Resp: 18  Temp: 98 F (36.7 C)  SpO2: 94%   Filed Weights   09/07/18 1000  Weight: 88 lb (39.9 kg)    Physical Exam Constitutional:      Appearance: Normal appearance. She is normal weight.  Cardiovascular:     Rate and Rhythm: Normal rate and regular rhythm.     Heart sounds: Normal heart sounds.  Pulmonary:     Breath sounds: Wheezing and rhonchi present.  Musculoskeletal: Normal range of motion.  Skin:    General: Skin is warm and dry.  Neurological:     Mental Status: She is alert and oriented to person, place, and time. Mental status is at baseline.  Psychiatric:        Mood and Affect: Mood normal.        Behavior: Behavior normal.        Thought Content: Thought content normal.        Judgment: Judgment normal.       LABORATORY DATA:  I have reviewed the labs as listed.  CBC    Component Value Date/Time   WBC 10.7 (H) 09/07/2018 1158   RBC 4.65 09/07/2018 1158   HGB 12.8 09/07/2018 1158   HGB 12.5 07/02/2018 1847   HCT 40.1 09/07/2018 1158   HCT 37.3 07/02/2018 1847   PLT 407 (H) 09/07/2018 1158   PLT 407 07/02/2018 1847   MCV 86.2 09/07/2018 1158   MCV 86 07/02/2018 1847   MCH 27.5 09/07/2018 1158   MCHC 31.9 09/07/2018 1158   RDW 12.5 09/07/2018 1158  RDW 12.4 07/02/2018 1847   LYMPHSABS 1.3 09/07/2018 1158   LYMPHSABS 2.1 07/02/2018 1847   MONOABS 0.8 09/07/2018 1158   EOSABS 0.1 09/07/2018 1158   EOSABS 0.1 07/02/2018 1847   BASOSABS 0.0 09/07/2018 1158   BASOSABS 0.1 07/02/2018 1847   CMP Latest Ref Rng & Units 09/07/2018 08/17/2018 07/02/2018  Glucose 70 - 99 mg/dL 122(H) 110(H) 84  BUN 6 - 20 mg/dL 10 5(L) 6(L)  Creatinine 0.44 - 1.00 mg/dL 0.47 0.54 0.64  Sodium 135 - 145 mmol/L 136 139 141  Potassium 3.5 - 5.1 mmol/L 3.9 4.0 4.4  Chloride 98 - 111 mmol/L 96(L) 104 103  CO2 22 - 32 mmol/L 30 25 23   Calcium 8.9 - 10.3 mg/dL 9.8 9.3 9.7  Total Protein 6.5 - 8.1 g/dL 7.5 7.7 6.9  Total Bilirubin 0.3 - 1.2 mg/dL 0.4 0.6 <0.2  Alkaline Phos 38 - 126 U/L 79 89 101  AST 15 - 41 U/L 15 14(L) 10  ALT 0 - 44 U/L 10 8 6        DIAGNOSTIC IMAGING:  I have independently reviewed the scans and discussed with the patient.   I have reviewed Francene Finders, NP's note and agree with the documentation.  I personally performed a face-to-face visit, made revisions and my assessment and plan is as follows.    ASSESSMENT & PLAN:   Adenocarcinoma of lung ([place]) 1.  Non-small cell lung cancer, adenocarcinoma: - Patient noticed lymphadenopathy in the neck region in October 2019.  She also has night sweats but denies any fevers or significant weight loss. - Difficulty swallowing for the past few weeks.  Drinking Ensure 1 to 2 cans/day. - CT of the chest without contrast on 08/12/2018  shows bulky right hilar and mediastinal adenopathy with bilateral supraclavicular adenopathy, scattered small lung lesions nonspecific.  1.2 cm liver lesion incompletely visualized, may represent cyst, hemangioma. -Physical exam reveals bilateral supraclavicular adenopathy. - She had a biopsy of the left supra clavicular lymph node on 08/30/2018 which showed metastatic poorly differentiated adenocarcinoma consistent with lung primary.  Tumor was strongly positive for CK7 and TTF-1.  Weak positivity for Napsin-A and MOC-31. - She is not taking in enough calories.  We will refer her for dietary consultation. - We discussed the results of the PET CT scan dated 08/23/2018.  This showed lymphadenopathy in the bilateral supraclavicular regions, mediastinum, subcarinal, right hilar more than left hilar.  No pulmonary pathology.  No subdiaphragmatic disease seen. -We discussed the results of the MRI of the brain without contrast dated 08/24/2018 which showed several small areas of nonspecific T2/flair hyperintensity in the cerebral white matter, some with mild T2 shine through on diffusion. -I have recommended CT scan of the brain with and without contrast.  Patient could not complete MRI with contrast because of claustrophobia. -I have also recommended consultation with radiation oncology.  I called and talked to [place]. - If she does not have brain metastatic disease, we could treat her as stage III (TXN3) with chemo and radiation. -If she does have brain metastatic disease, she will require whole brain radiation therapy. - We will also make referral to Drs. Jenkins/Bridges for port placement.  2.  Right shoulder pain: -She has pain in the right scapula, radiating into the axilla and upper arm. -She has tried tramadol which did not help. -We will start her on hydrocodone 5 mg twice a day as needed.  3.  COPD: -She was short of breath in our office.  She  had both inspiratory and expiratory  rhonchi. -Shortness of breath improved after DuoNeb nebulizer.  We have given her prescription for Combivent inhaler.      Orders placed this encounter:  Orders Placed This Encounter  Procedures  . CT Head W Wo Contrast  . Lactate dehydrogenase  . Magnesium  . CBC with Differential/Platelet  . Comprehensive metabolic panel  . Lactate dehydrogenase  . Magnesium  . CBC with Differential/Platelet  . Comprehensive metabolic panel      Derek Jack, MD [place] [phone]

## 2018-09-07 NOTE — Patient Instructions (Signed)
[name] at [facility] Discharge Instructions     Thank you for choosing [facility] at [facility] to provide your oncology and hematology care.  To afford each patient quality time with our provider, please arrive at least 15 minutes before your scheduled appointment time.   If you have a lab appointment with the [name] please come in thru the  Main Entrance and check in at the main information desk  You need to re-schedule your appointment should you arrive 10 or more minutes late.  We strive to give you quality time with our providers, and arriving late affects you and other patients whose appointments are after yours.  Also, if you no show three or more times for appointments you may be dismissed from the clinic at the providers discretion.     Again, thank you for choosing [name].  Our hope is that these requests will decrease the amount of time that you wait before being seen by our physicians.       _____________________________________________________________  Should you have questions after your visit to [name], please contact our office at [phone] between the hours of 8:00 a.m. and 4:30 p.m.  Voicemails left after 4:00 p.m. will not be returned until the following business day.  For prescription refill requests, have your pharmacy contact our office and allow 72 hours.    [name] Support Programs:   > Cancer Support Group  2nd Tuesday of the month 1pm-2pm, Journey Room    

## 2018-09-08 ENCOUNTER — Encounter (HOSPITAL_COMMUNITY): Payer: Self-pay | Admitting: Lab

## 2018-09-08 NOTE — H&P (Signed)
Erika Turner; [id number]; [birth year]   HPI Patient is a 63 year old white female who was referred to my care by Dr. Delton Coombes for a lymph node biopsy.  Patient has generalized lymphadenopathy of unknown etiology.  Patient states that she has had some voice difficulties recently.  She is also suffering from dysphasia.  She has 2 out of 10 back pain.  She does feel weak.  She states her lymph node swelling is primarily in the neck region.  She denies any other lymphadenopathy.  Patient was found on recent biopsy to have adenocarcinoma most likely from the right lung.  She now presents for Port-A-Cath insertion. Past Medical History:  Diagnosis Date  . GAD (generalized anxiety disorder) 01/21/2017  . Lymphadenopathy   . Underweight 01/21/2017    Past Surgical History:  Procedure Laterality Date  . ABDOMINAL HYSTERECTOMY    . CESAREAN SECTION     x 2    Family History  Problem Relation Age of Onset  . Diabetes Mother   . Heart disease Mother        Pacemaker   . Cancer Father        Lung  . Cancer Brother        bladder then colon  . Down syndrome Brother   . Mental illness Daughter     Current Outpatient Medications on File Prior to Visit  Medication Sig Dispense Refill  . diphenhydramine-acetaminophen (TYLENOL PM) 25-500 MG TABS tablet Take 1 tablet by mouth at bedtime as needed.    Marland Kitchen escitalopram (LEXAPRO) 10 MG tablet Take 1 tablet (10 mg total) by mouth daily. 90 tablet 3  . fluconazole (DIFLUCAN) 100 MG tablet Take 1 tablet (100 mg total) by mouth daily. 5 tablet 0   No current facility-administered medications on file prior to visit.     Allergies  Allergen Reactions  . Biaxin [Clarithromycin] Nausea And Vomiting    Social History   Substance and Sexual Activity  Alcohol Use No    Social History   Tobacco Use  Smoking Status Current Every Day Smoker  . Packs/day: 1.00  . Years: 40.00  . Pack years: 40.00  . Types: Cigarettes  . Start date: 11/02/1978   Smokeless Tobacco Never Used    Review of Systems  Constitutional: Positive for malaise/fatigue.  HENT: Positive for sore throat.   Eyes: Positive for pain.  Respiratory: Positive for cough, shortness of breath and wheezing.   Cardiovascular: Negative.   Gastrointestinal: Positive for heartburn.  Genitourinary: Negative.   Musculoskeletal: Positive for back pain, joint pain and neck pain.  Skin: Negative.   Neurological: Negative.   Endo/Heme/Allergies: Negative.   Psychiatric/Behavioral: Negative.     Objective   Vitals:   08/24/18 0942  BP: 122/87  Pulse: 98  Resp: 16  Temp: 98 F (36.7 C)    Physical Exam Vitals signs reviewed.  Constitutional:      Appearance: Normal appearance. She is not ill-appearing.     Comments: Cachectic.  HENT:     Head: Normocephalic and atraumatic.  Neck:     Musculoskeletal: Normal range of motion and neck supple.     Comments: Bilateral cervical lymphadenopathy, left greater than right with supraclavicular lymph nodes also present. Cardiovascular:     Rate and Rhythm: Normal rate and regular rhythm.     Heart sounds: Normal heart sounds. No murmur. No gallop.   Pulmonary:     Effort: Pulmonary effort is normal. No respiratory distress.     Breath sounds:  Normal breath sounds. No stridor. No wheezing, rhonchi or rales.  Lymphadenopathy:     Cervical: Cervical adenopathy present.  Skin:    General: Skin is warm and dry.     Coloration: Skin is not pale.  Neurological:     Mental Status: She is alert and oriented to person, place, and time.    PET scan reviewed Dr. Tomie China notes reviewed Assessment  Right lung carcinoma, need for central venous access Plan   Patient is scheduled for Port-A-Cath insertion on 09/17/2018.  The risks and benefits of the procedure including bleeding, infection, and the possibility of pneumothorax were fully explained to the patient, who gave informed consent.

## 2018-09-08 NOTE — Progress Notes (Signed)
Referral sent to [facility].  Records faxed on 1/8

## 2018-09-10 ENCOUNTER — Encounter (HOSPITAL_COMMUNITY): Payer: Managed Care, Other (non HMO)

## 2018-09-15 ENCOUNTER — Encounter (HOSPITAL_COMMUNITY)
Admission: RE | Admit: 2018-09-15 | Discharge: 2018-09-15 | Disposition: A | Discharge status: Home / Self Care | Payer: Managed Care, Other (non HMO) | Source: Ambulatory Visit | Attending: General Surgery | Admitting: General Surgery

## 2018-09-15 ENCOUNTER — Encounter (HOSPITAL_COMMUNITY): Payer: Self-pay

## 2018-09-17 ENCOUNTER — Encounter (HOSPITAL_COMMUNITY): Payer: Self-pay | Admitting: *Deleted

## 2018-09-17 ENCOUNTER — Ambulatory Visit (HOSPITAL_COMMUNITY)
Admission: RE | Admit: 2018-09-17 | Discharge: 2018-09-17 | Disposition: A | Discharge status: Home / Self Care | Payer: Managed Care, Other (non HMO) | Source: Ambulatory Visit | Attending: Nurse Practitioner | Admitting: Nurse Practitioner

## 2018-09-17 ENCOUNTER — Ambulatory Visit (HOSPITAL_COMMUNITY): Payer: Managed Care, Other (non HMO)

## 2018-09-17 ENCOUNTER — Ambulatory Visit (HOSPITAL_COMMUNITY)
Admission: RE | Admit: 2018-09-17 | Discharge: 2018-09-17 | Disposition: A | Discharge status: Home / Self Care | Payer: Managed Care, Other (non HMO) | Attending: General Surgery | Admitting: General Surgery

## 2018-09-17 ENCOUNTER — Encounter (HOSPITAL_COMMUNITY)
Admission: RE | Disposition: A | Discharge status: Home / Self Care | Payer: Self-pay | Source: Home / Self Care | Attending: General Surgery

## 2018-09-17 ENCOUNTER — Ambulatory Visit (HOSPITAL_COMMUNITY): Payer: Managed Care, Other (non HMO) | Admitting: Anesthesiology

## 2018-09-17 DIAGNOSIS — F17210 Nicotine dependence, cigarettes, uncomplicated: Secondary | ICD-10-CM | POA: Diagnosis not present

## 2018-09-17 DIAGNOSIS — F411 Generalized anxiety disorder: Secondary | ICD-10-CM | POA: Insufficient documentation

## 2018-09-17 DIAGNOSIS — Z800 Family history of malignant neoplasm of digestive organs: Secondary | ICD-10-CM | POA: Insufficient documentation

## 2018-09-17 DIAGNOSIS — Z8052 Family history of malignant neoplasm of bladder: Secondary | ICD-10-CM | POA: Diagnosis not present

## 2018-09-17 DIAGNOSIS — Z888 Allergy status to other drugs, medicaments and biological substances status: Secondary | ICD-10-CM | POA: Insufficient documentation

## 2018-09-17 DIAGNOSIS — R636 Underweight: Secondary | ICD-10-CM | POA: Diagnosis not present

## 2018-09-17 DIAGNOSIS — Z801 Family history of malignant neoplasm of trachea, bronchus and lung: Secondary | ICD-10-CM | POA: Insufficient documentation

## 2018-09-17 DIAGNOSIS — Z681 Body mass index (BMI) 19 or less, adult: Secondary | ICD-10-CM | POA: Insufficient documentation

## 2018-09-17 DIAGNOSIS — Z90710 Acquired absence of both cervix and uterus: Secondary | ICD-10-CM | POA: Insufficient documentation

## 2018-09-17 DIAGNOSIS — R591 Generalized enlarged lymph nodes: Secondary | ICD-10-CM | POA: Insufficient documentation

## 2018-09-17 DIAGNOSIS — Z810 Family history of intellectual disabilities: Secondary | ICD-10-CM | POA: Insufficient documentation

## 2018-09-17 DIAGNOSIS — Z818 Family history of other mental and behavioral disorders: Secondary | ICD-10-CM | POA: Insufficient documentation

## 2018-09-17 DIAGNOSIS — Z8249 Family history of ischemic heart disease and other diseases of the circulatory system: Secondary | ICD-10-CM | POA: Insufficient documentation

## 2018-09-17 DIAGNOSIS — C799 Secondary malignant neoplasm of unspecified site: Secondary | ICD-10-CM | POA: Insufficient documentation

## 2018-09-17 DIAGNOSIS — Z85118 Personal history of other malignant neoplasm of bronchus and lung: Secondary | ICD-10-CM | POA: Insufficient documentation

## 2018-09-17 DIAGNOSIS — Z79899 Other long term (current) drug therapy: Secondary | ICD-10-CM | POA: Diagnosis not present

## 2018-09-17 DIAGNOSIS — Z95828 Presence of other vascular implants and grafts: Secondary | ICD-10-CM

## 2018-09-17 DIAGNOSIS — Z833 Family history of diabetes mellitus: Secondary | ICD-10-CM | POA: Insufficient documentation

## 2018-09-17 DIAGNOSIS — C3490 Malignant neoplasm of unspecified part of unspecified bronchus or lung: Secondary | ICD-10-CM | POA: Insufficient documentation

## 2018-09-17 HISTORY — PX: PORTACATH PLACEMENT: SHX2246

## 2018-09-17 SURGERY — INSERTION, TUNNELED CENTRAL VENOUS DEVICE, WITH PORT
Anesthesia: Monitor Anesthesia Care | Site: Chest | Laterality: Right

## 2018-09-17 MED ORDER — PROPOFOL 500 MG/50ML IV EMUL
INTRAVENOUS | Status: DC | PRN
  Administered 2018-09-17: 100 ug/kg/min via INTRAVENOUS

## 2018-09-17 MED ORDER — CHLORHEXIDINE GLUCONATE CLOTH 2 % EX PADS: 6.0000 | MEDICATED_PAD | Freq: Once | CUTANEOUS | Status: DC

## 2018-09-17 MED ORDER — PROMETHAZINE HCL 25 MG/ML IJ SOLN: 6.2500 mg | INTRAMUSCULAR | Status: DC | PRN

## 2018-09-17 MED ORDER — LACTATED RINGERS IV SOLN
INTRAVENOUS | Status: DC
  Administered 2018-09-17: 1000 mL via INTRAVENOUS

## 2018-09-17 MED ORDER — SODIUM CHLORIDE (PF) 0.9 % IJ SOLN
INTRAMUSCULAR | Status: DC | PRN
  Administered 2018-09-17: 500 mL

## 2018-09-17 MED ORDER — KETOROLAC TROMETHAMINE 30 MG/ML IJ SOLN: 15.0000 mg | Freq: Once | INTRAMUSCULAR | Status: DC

## 2018-09-17 MED ORDER — HYDROCODONE-ACETAMINOPHEN 7.5-325 MG PO TABS: 1.0000 | ORAL_TABLET | Freq: Once | ORAL | Status: DC | PRN

## 2018-09-17 MED ORDER — LIDOCAINE HCL (PF) 1 % IJ SOLN
INTRAMUSCULAR | Status: AC
  Filled 2018-09-17: qty 30

## 2018-09-17 MED ORDER — MIDAZOLAM HCL 5 MG/5ML IJ SOLN
INTRAMUSCULAR | Status: DC | PRN
  Administered 2018-09-17 (×2): 1 mg via INTRAVENOUS

## 2018-09-17 MED ORDER — MIDAZOLAM HCL 2 MG/2ML IJ SOLN: 0.5000 mg | Freq: Once | INTRAMUSCULAR | Status: DC | PRN

## 2018-09-17 MED ORDER — IOHEXOL 300 MG/ML  SOLN
75.0000 mL | Freq: Once | INTRAMUSCULAR | Status: AC | PRN
  Administered 2018-09-17: 75 mL via INTRAVENOUS

## 2018-09-17 MED ORDER — PROPOFOL 10 MG/ML IV BOLUS
INTRAVENOUS | Status: AC
  Filled 2018-09-17: qty 20

## 2018-09-17 MED ORDER — KETAMINE HCL 10 MG/ML IJ SOLN
INTRAMUSCULAR | Status: DC | PRN
  Administered 2018-09-17 (×2): 10 mg via INTRAVENOUS

## 2018-09-17 MED ORDER — HEPARIN SOD (PORK) LOCK FLUSH 100 UNIT/ML IV SOLN
INTRAVENOUS | Status: DC | PRN
  Administered 2018-09-17: 500 [IU]

## 2018-09-17 MED ORDER — ONDANSETRON HCL 4 MG/2ML IJ SOLN
INTRAMUSCULAR | Status: AC
  Filled 2018-09-17: qty 2

## 2018-09-17 MED ORDER — LIDOCAINE HCL (PF) 1 % IJ SOLN
INTRAMUSCULAR | Status: DC | PRN
  Administered 2018-09-17: 8 mL via SUBCUTANEOUS

## 2018-09-17 MED ORDER — ONDANSETRON HCL 4 MG/2ML IJ SOLN
INTRAMUSCULAR | Status: DC | PRN
  Administered 2018-09-17: 4 mg via INTRAVENOUS

## 2018-09-17 MED ORDER — PROPOFOL 10 MG/ML IV BOLUS
INTRAVENOUS | Status: DC | PRN
  Administered 2018-09-17: 20 mg via INTRAVENOUS

## 2018-09-17 MED ORDER — KETAMINE HCL 50 MG/5ML IJ SOSY
PREFILLED_SYRINGE | INTRAMUSCULAR | Status: AC
  Filled 2018-09-17: qty 5

## 2018-09-17 MED ORDER — MIDAZOLAM HCL 2 MG/2ML IJ SOLN
INTRAMUSCULAR | Status: AC
  Filled 2018-09-17: qty 2

## 2018-09-17 MED ORDER — HYDROMORPHONE HCL 1 MG/ML IJ SOLN: 0.2500 mg | INTRAMUSCULAR | Status: DC | PRN

## 2018-09-17 MED ORDER — CEFAZOLIN SODIUM-DEXTROSE 2-4 GM/100ML-% IV SOLN
2.0000 g | INTRAVENOUS | Status: AC
  Administered 2018-09-17: 2 g via INTRAVENOUS
  Filled 2018-09-17: qty 100

## 2018-09-17 MED ORDER — HEPARIN SOD (PORK) LOCK FLUSH 100 UNIT/ML IV SOLN
INTRAVENOUS | Status: AC
  Filled 2018-09-17: qty 5

## 2018-09-17 SURGICAL SUPPLY — 33 items
ADH SKN CLS APL DERMABOND .7 (GAUZE/BANDAGES/DRESSINGS) ×1
BAG DECANTER FOR FLEXI CONT (MISCELLANEOUS) ×3 IMPLANT
CHLORAPREP W/TINT 10.5 ML (MISCELLANEOUS) ×3 IMPLANT
CLOTH BEACON ORANGE TIMEOUT ST (SAFETY) ×3 IMPLANT
COVER LIGHT HANDLE STERIS (MISCELLANEOUS) ×6 IMPLANT
COVER WAND RF STERILE (DRAPES) ×2 IMPLANT
DECANTER SPIKE VIAL GLASS SM (MISCELLANEOUS) ×3 IMPLANT
DERMABOND ADVANCED (GAUZE/BANDAGES/DRESSINGS) ×2
DERMABOND ADVANCED .7 DNX12 (GAUZE/BANDAGES/DRESSINGS) ×1 IMPLANT
DRAPE C-ARM FOLDED MOBILE STRL (DRAPES) ×3 IMPLANT
ELECT REM PT RETURN 9FT ADLT (ELECTROSURGICAL) ×3
ELECTRODE REM PT RTRN 9FT ADLT (ELECTROSURGICAL) ×1 IMPLANT
GAUZE SPONGE 4X4 16PLY XRAY LF (GAUZE/BANDAGES/DRESSINGS) ×2 IMPLANT
GLOVE BIO SURGEON STRL SZ7 (GLOVE) ×2 IMPLANT
GLOVE BIOGEL PI IND STRL 7.0 (GLOVE) ×2 IMPLANT
GLOVE BIOGEL PI INDICATOR 7.0 (GLOVE) ×4
GLOVE SURG SS PI 7.5 STRL IVOR (GLOVE) ×3 IMPLANT
GOWN STRL REUS W/TWL LRG LVL3 (GOWN DISPOSABLE) ×6 IMPLANT
IV NS 500ML (IV SOLUTION) ×3
IV NS 500ML BAXH (IV SOLUTION) ×1 IMPLANT
KIT PORT POWER 8FR ISP MRI (Port) ×3 IMPLANT
KIT TURNOVER KIT A (KITS) ×3 IMPLANT
NDL HYPO 25X1 1.5 SAFETY (NEEDLE) ×1 IMPLANT
NEEDLE HYPO 25X1 1.5 SAFETY (NEEDLE) ×3 IMPLANT
PACK MINOR (CUSTOM PROCEDURE TRAY) ×3 IMPLANT
PAD ARMBOARD 7.5X6 YLW CONV (MISCELLANEOUS) ×3 IMPLANT
SET BASIN LINEN APH (SET/KITS/TRAYS/PACK) ×3 IMPLANT
SUT MNCRL AB 4-0 PS2 18 (SUTURE) ×3 IMPLANT
SUT VIC AB 3-0 SH 27 (SUTURE) ×3
SUT VIC AB 3-0 SH 27X BRD (SUTURE) ×1 IMPLANT
SYR 20CC LL (SYRINGE) ×3 IMPLANT
SYR 5ML LL (SYRINGE) ×3 IMPLANT
SYR CONTROL 10ML LL (SYRINGE) ×3 IMPLANT

## 2018-09-17 NOTE — Anesthesia Postprocedure Evaluation (Signed)
Anesthesia Post Note  Patient: [facility]  Procedure(s) Performed: INSERTION PORT-A-CATH (ATTACHED CATHETER IN RIGHT SUBCLAVIAN) (Right Chest)  Patient location during evaluation: PACU Anesthesia Type: MAC Level of consciousness: awake and alert and patient cooperative Pain management: satisfactory to patient Vital Signs Assessment: post-procedure vital signs reviewed and stable Respiratory status: spontaneous breathing Cardiovascular status: stable Postop Assessment: no apparent nausea or vomiting Anesthetic complications: no     Last Vitals:  Vitals:   09/17/18 1145 09/17/18 1200  BP: 107/83 116/81  Pulse: (!) 115 (!) 108  Resp: 18 11  Temp:    SpO2: 99% 93%    Last Pain:  Vitals:   09/17/18 1200  TempSrc:   PainSc: 0-No pain                 Ascher Schroepfer

## 2018-09-17 NOTE — Transfer of Care (Signed)
Immediate Anesthesia Transfer of Care Note  Patient: E[facility]  Procedure(s) Performed: INSERTION PORT-A-CATH (ATTACHED CATHETER IN RIGHT SUBCLAVIAN) (Right Chest)  Patient Location: PACU  Anesthesia Type:MAC  Level of Consciousness: awake and patient cooperative  Airway & Oxygen Therapy: Patient Spontanous Breathing  Post-op Assessment: Report given to RN and Post -op Vital signs reviewed and stable  Post vital signs: Reviewed and stable  Last Vitals:  Vitals Value Taken Time  BP 123/82 09/17/2018 11:35 AM  Temp    Pulse 120 09/17/2018 11:36 AM  Resp 16 09/17/2018 11:36 AM  SpO2 93 % 09/17/2018 11:36 AM  Vitals shown include unvalidated device data.  Last Pain:  Vitals:   09/17/18 1044  TempSrc: Axillary  PainSc: 3       Patients Stated Pain Goal: 7 (28/76/81 1572)  Complications: No apparent anesthesia complications

## 2018-09-17 NOTE — Anesthesia Preprocedure Evaluation (Signed)
Anesthesia Evaluation  Patient identified by MRN, date of birth, ID band Patient awake    Reviewed: Allergy & Precautions, NPO status , Patient's Chart, lab work & pertinent test results  Airway Mallampati: I  TM Distance: >3 FB Neck ROM: Full    Dental no notable dental hx. (+) Teeth Intact   Pulmonary neg pulmonary ROS, Current Smoker,  Lung Ca -appears SOB at resp - denies o2 use -audible inspiration -states at baseline    Pulmonary exam normal breath sounds clear to auscultation       Cardiovascular Exercise Tolerance: Poor negative cardio ROS Normal cardiovascular examII Rhythm:Regular Rate:Normal     Neuro/Psych Anxiety negative neurological ROS  negative psych ROS   GI/Hepatic negative GI ROS, Neg liver ROS,   Endo/Other  negative endocrine ROS  Renal/GU negative Renal ROS  negative genitourinary   Musculoskeletal negative musculoskeletal ROS (+)   Abdominal   Peds negative pediatric ROS (+)  Hematology negative hematology ROS (+)   Anesthesia Other Findings   Reproductive/Obstetrics negative OB ROS                             Anesthesia Physical Anesthesia Plan  ASA: IV  Anesthesia Plan: MAC   Post-op Pain Management:    Induction:   PONV Risk Score and Plan:   Airway Management Planned: Nasal Cannula and Simple Face Mask  Additional Equipment:   Intra-op Plan:   Post-operative Plan:   Informed Consent:   Plan Discussed with:   Anesthesia Plan Comments: (D/w pt /surgeon MAC planned  H/o VC issues -will attempt to do this with airway instrumentation )        Anesthesia Quick Evaluation

## 2018-09-17 NOTE — Discharge Instructions (Signed)
Implanted Port Home Guide °An implanted port is a device that is placed under the skin. It is usually placed in the chest. The device can be used to give IV medicine, to take blood, or for dialysis. You may have an implanted port if: °· You need IV medicine that would be irritating to the small veins in your hands or arms. °· You need IV medicines, such as antibiotics, for a long period of time. °· You need IV nutrition for a long period of time. °· You need dialysis. °Having a port means that your health care provider will not need to use the veins in your arms for these procedures. You may have fewer limitations when using a port than you would if you used other types of long-term IVs, and you will likely be able to return to normal activities after your incision heals. °An implanted port has two main parts: °· Reservoir. The reservoir is the part where a needle is inserted to give medicines or draw blood. The reservoir is round. After it is placed, it appears as a small, raised area under your skin. °· Catheter. The catheter is a thin, flexible tube that connects the reservoir to a vein. Medicine that is inserted into the reservoir goes into the catheter and then into the vein. °How is my port accessed? °To access your port: °· A numbing cream may be placed on the skin over the port site. °· Your health care provider will put on a mask and sterile gloves. °· The skin over your port will be cleaned carefully with a germ-killing soap and allowed to dry. °· Your health care provider will gently pinch the port and insert a needle into it. °· Your health care provider will check for a blood return to make sure the port is in the vein and is not clogged. °· If your port needs to remain accessed to get medicine continuously (constant infusion), your health care provider will place a clear bandage (dressing) over the needle site. The dressing and needle will need to be changed every week, or as told by your health care  provider. °What is flushing? °Flushing helps keep the port from getting clogged. Follow instructions from your health care provider about how and when to flush the port. Ports are usually flushed with saline solution or a medicine called heparin. The need for flushing will depend on how the port is used: °· If the port is only used from time to time to give medicines or draw blood, the port may need to be flushed: °? Before and after medicines have been given. °? Before and after blood has been drawn. °? As part of routine maintenance. Flushing may be recommended every 4-6 weeks. °· If a constant infusion is running, the port may not need to be flushed. °· Throw away any syringes in a disposal container that is meant for sharp items (sharps container). You can buy a sharps container from a pharmacy, or you can make one by using an empty hard plastic bottle with a cover. °How long will my port stay implanted? °The port can stay in for as long as your health care provider thinks it is needed. When it is time for the port to come out, a surgery will be done to remove it. The surgery will be similar to the procedure that was done to put the port in. °Follow these instructions at home: ° °· Flush your port as told by your health care provider. °·   If you need an infusion over several days, follow instructions from your health care provider about how to take care of your port site. Make sure you: ? Wash your hands with soap and water before you change your dressing. If soap and water are not available, use alcohol-based hand sanitizer. ? Change your dressing as told by your health care provider. ? Place any used dressings or infusion bags into a plastic bag. Throw that bag in the trash. ? Keep the dressing that covers the needle clean and dry. Do not get it wet. ? Do not use scissors or sharp objects near the tube. ? Keep the tube clamped, unless it is being used.  Check your port site every day for signs of  infection. Check for: ? Redness, swelling, or pain. ? Fluid or blood. ? Pus or a bad smell.  Protect the skin around the port site. ? Avoid wearing bra straps that rub or irritate the site. ? Protect the skin around your port from seat belts. Place a soft pad over your chest if needed.  Bathe or shower as told by your health care provider. The site may get wet as long as you are not actively receiving an infusion.  Return to your normal activities as told by your health care provider. Ask your health care provider what activities are safe for you.  Carry a medical alert card or wear a medical alert bracelet at all times. This will let health care providers know that you have an implanted port in case of an emergency. Get help right away if:  You have redness, swelling, or pain at the port site.  You have fluid or blood coming from your port site.  You have pus or a bad smell coming from the port site.  You have a fever. Summary  Implanted ports are usually placed in the chest for long-term IV access.  Follow instructions from your health care provider about flushing the port and changing bandages (dressings).  Take care of the area around your port by avoiding clothing that puts pressure on the area, and by watching for signs of infection.  Protect the skin around your port from seat belts. Place a soft pad over your chest if needed.  Get help right away if you have a fever or you have redness, swelling, pain, drainage, or a bad smell at the port site. This information is not intended to replace advice given to you by your health care provider. Make sure you discuss any questions you have with your health care provider. Document Released: 08/18/2005 Document Revised: 09/20/2016 Document Reviewed: 09/20/2016 Elsevier Interactive Patient Education  2019 [employer]   PATIENT INSTRUCTIONS POST-ANESTHESIA  IMMEDIATELY FOLLOWING SURGERY:  Do not drive or operate machinery for  the first twenty four hours after surgery.  Do not make any important decisions for twenty four hours after surgery or while taking narcotic pain medications or sedatives.  If you develop intractable nausea and vomiting or a severe headache please notify your doctor immediately.  FOLLOW-UP:  Please make an appointment with your surgeon as instructed. You do not need to follow up with anesthesia unless specifically instructed to do so.  WOUND CARE INSTRUCTIONS (if applicable):  Keep a dry clean dressing on the anesthesia/puncture wound site if there is drainage.  Once the wound has quit draining you may leave it open to air.  Generally you should leave the bandage intact for twenty four hours unless there is drainage.  If  the epidural site drains for more than 36-48 hours please call the anesthesia department.  QUESTIONS?:  Please feel free to call your physician or the hospital operator if you have any questions, and they will be happy to assist you.

## 2018-09-17 NOTE — Interval H&P Note (Signed)
History and Physical Interval Note:  09/17/2018 10:32 AM  Erika Turner  has presented today for surgery, with the diagnosis of right lung cancer  The various methods of treatment have been discussed with the patient and family. After consideration of risks, benefits and other options for treatment, the patient has consented to  Procedure(s): INSERTION PORT-A-CATH (Left) as a surgical intervention .  The patient's history has been reviewed, patient examined, no change in status, stable for surgery.  I have reviewed the patient's chart and labs.  Questions were answered to the patient's satisfaction.     Aviva Signs

## 2018-09-17 NOTE — Op Note (Signed)
Patient:  Erika Turner  DOB:  1955/11/14  MRN:  [id number]   Preop Diagnosis: Metastatic adenocarcinoma of lung  Postop Diagnosis: Same  Procedure: Port-A-Cath insertion  Surgeon: Aviva Signs, MD  Anes: MAC  Indications: Patient is a 63 year old white female who presents for Port-A-Cath insertion due to the need for central venous access for chemotherapy.  The risks and benefits of the procedure including bleeding, infection, and pneumothorax were fully explained to the patient, who gave informed consent.  Procedure note: The patient was placed in Trendelenburg position after the right upper chest was prepped and draped using the usual sterile technique with DuraPrep.  Surgical site confirmation was performed.  1% Xylocaine was used for local anesthesia.  Incision was made below the right clavicle.  A subcutaneous pocket was formed.  The needle was advanced into the right subclavian vein using the Seldinger technique without difficulty.  A guidewire was then advanced into the right atrium under fluoroscopic guidance.  An introducer and peel-away sheath were placed over the guidewire.  The cath was inserted through the peel-away sheath and the peel-away sheath was removed.  The catheter was then attached to the port and the port placed in subcutaneous pocket.  Adequate positioning was confirmed by fluoroscopy.  Good backflow blood was noted on aspiration of the port.  The port was flushed with heparin flush.  Subcutaneous layer was reapproximated using a 3-0 Vicryl interrupted suture.  The skin was closed using a 4-0 Monocryl subcuticular suture.  Dermabond was applied.  All tape and needle counts were correct at the end of the procedure.  Patient was awakened and transferred to PACU in stable condition.  A chest x-ray will be performed at that time.  Complications: None  EBL: Minimal  Specimen: None

## 2018-09-20 ENCOUNTER — Encounter (HOSPITAL_COMMUNITY): Payer: Self-pay | Admitting: General Surgery

## 2018-09-21 ENCOUNTER — Encounter (HOSPITAL_COMMUNITY): Payer: Self-pay | Admitting: *Deleted

## 2018-09-21 ENCOUNTER — Inpatient Hospital Stay (HOSPITAL_COMMUNITY): Payer: Managed Care, Other (non HMO) | Admitting: Dietician

## 2018-09-21 ENCOUNTER — Inpatient Hospital Stay (HOSPITAL_BASED_OUTPATIENT_CLINIC_OR_DEPARTMENT_OTHER): Payer: Managed Care, Other (non HMO) | Admitting: Hematology

## 2018-09-21 ENCOUNTER — Encounter (HOSPITAL_COMMUNITY): Payer: Self-pay | Admitting: Hematology

## 2018-09-21 VITALS — BP 97/74 | HR 110 | Temp 97.7°F | Resp 22 | Wt 82.0 lb

## 2018-09-21 DIAGNOSIS — M25511 Pain in right shoulder: Secondary | ICD-10-CM

## 2018-09-21 DIAGNOSIS — Z79899 Other long term (current) drug therapy: Secondary | ICD-10-CM

## 2018-09-21 DIAGNOSIS — F40240 Claustrophobia: Secondary | ICD-10-CM

## 2018-09-21 DIAGNOSIS — F17210 Nicotine dependence, cigarettes, uncomplicated: Secondary | ICD-10-CM

## 2018-09-21 DIAGNOSIS — R112 Nausea with vomiting, unspecified: Secondary | ICD-10-CM

## 2018-09-21 DIAGNOSIS — Z791 Long term (current) use of non-steroidal anti-inflammatories (NSAID): Secondary | ICD-10-CM

## 2018-09-21 DIAGNOSIS — C3490 Malignant neoplasm of unspecified part of unspecified bronchus or lung: Secondary | ICD-10-CM | POA: Diagnosis not present

## 2018-09-21 DIAGNOSIS — J449 Chronic obstructive pulmonary disease, unspecified: Secondary | ICD-10-CM

## 2018-09-21 DIAGNOSIS — R634 Abnormal weight loss: Secondary | ICD-10-CM | POA: Diagnosis not present

## 2018-09-21 DIAGNOSIS — R21 Rash and other nonspecific skin eruption: Secondary | ICD-10-CM | POA: Diagnosis not present

## 2018-09-21 DIAGNOSIS — R1310 Dysphagia, unspecified: Secondary | ICD-10-CM

## 2018-09-21 DIAGNOSIS — R531 Weakness: Secondary | ICD-10-CM

## 2018-09-21 DIAGNOSIS — R072 Precordial pain: Secondary | ICD-10-CM

## 2018-09-21 DIAGNOSIS — M542 Cervicalgia: Secondary | ICD-10-CM

## 2018-09-21 NOTE — Progress Notes (Signed)
I met with patient during office visit today. I provided my contact information with her and advised her of my role in her care. I provided her with written education on carboplatin and taxol that she will potentially be receiving with radiation beginning next week.  I advised her to call me with any questions or concerns before her office visit with [place] next week or she can bring those questions to her next appointment. She and her husband verbalize understanding.

## 2018-09-21 NOTE — Progress Notes (Signed)
[place],  [id number]   CLINIC:  Medical Oncology/Hematology  PCP:  Sharion Balloon, [address] [place] [address] [phone]   REASON FOR VISIT: Follow-up for adenocarcinoma of the lung  CURRENT THERAPY: chemoradiation    INTERVAL HISTORY:  Ms. Zhan 63 y.o. female returns for routine follow-up for adenocarcinoma of the lung. She is here today with her husband. She is unable to eat very much. She can not tolerate the boost or ensure. She gags on a lot of food and dry heaves everyday. She does vomit up her food occasionally. She eats soft foods like mash potatoes and cooked vegetables. She has lost 5 more pounds since her last visit two weeks ago. She had her port placed but she is unable to tolerate radiation because she can not lay flat on the table. She is severely SOB when she does any activity or when she lays flat. She can lay on her side to sleep. She is also getting sore red spots on her left hip and tailbone. Denies any diarrhea.  Had not noticed any recent bleeding such as epistaxis, hematuria or hematochezia. Denies recent chest pain on exertion, pre-syncopal episodes, or palpitations. Denies any numbness or tingling in hands or feet. Denies any recent fevers, infections, or recent hospitalizations. Patient reports appetite at 25% and energy level at 25%.    REVIEW OF SYSTEMS:  Review of Systems  Constitutional: Positive for appetite change and fatigue.  HENT:   Positive for trouble swallowing.   Respiratory: Positive for shortness of breath.   Gastrointestinal: Positive for nausea and vomiting.  All other systems reviewed and are negative.    PAST MEDICAL/SURGICAL HISTORY:  Past Medical History:  Diagnosis Date  . GAD (generalized anxiety disorder) 01/21/2017  . Lymphadenopathy   . Underweight 01/21/2017   Past Surgical History:  Procedure Laterality Date  . ABDOMINAL HYSTERECTOMY    . CESAREAN  SECTION     x 2  . LYMPH NODE BIOPSY Left 08/30/2018   Procedure: CERVICAL LYMPH NODE BIOPSY;  Surgeon: Aviva Signs, MD;  Location: [facility];  Service: General;  Laterality: Left;  . PORTACATH PLACEMENT Right 09/17/2018   Procedure: INSERTION PORT-A-CATH (ATTACHED CATHETER IN RIGHT SUBCLAVIAN);  Surgeon: Aviva Signs, MD;  Location: [facility];  Service: General;  Laterality: Right;     SOCIAL HISTORY:  Social History   Socioeconomic History  . Marital status: Married    Spouse name: Not on file  . Number of children: 2  . Years of education: Not on file  . Highest education level: Not on file  Occupational History  . Occupation: housekeeper  Social Needs  . Financial resource strain: Not hard at all  . Food insecurity:    Worry: Never true    Inability: Never true  . Transportation needs:    Medical: No    Non-medical: No  Tobacco Use  . Smoking status: Current Every Day Smoker    Packs/day: 1.00    Years: 40.00    Pack years: 40.00    Types: Cigarettes    Start date: 11/02/1978  . Smokeless tobacco: Never Used  Substance and Sexual Activity  . Alcohol use: No  . Drug use: No  . Sexual activity: Not on file  Lifestyle  . Physical activity:    Days per week: 0 days    Minutes per session: 0 min  . Stress: To some extent  Relationships  . Social  connections:    Talks on phone: Twice a week    Gets together: Never    Attends religious service: Never    Active member of club or organization: No    Attends meetings of clubs or organizations: Never    Relationship status: Married  . Intimate partner violence:    Fear of current or ex partner: No    Emotionally abused: No    Physically abused: No    Forced sexual activity: No  Other Topics Concern  . Not on file  Social History Narrative   Housekeeping at [employer].    FAMILY HISTORY:  Family History  Problem Relation Age of Onset  . Diabetes Mother   . Heart disease Mother        Pacemaker   . Cancer  Father        Lung  . Cancer Brother        bladder then colon  . Down syndrome Brother   . Mental illness Daughter     CURRENT MEDICATIONS:  Outpatient Encounter Medications as of 09/21/2018  Medication Sig  . escitalopram (LEXAPRO) 10 MG tablet Take 1 tablet (10 mg total) by mouth daily.  . diphenhydramine-acetaminophen (TYLENOL PM) 25-500 MG TABS tablet Take 1 tablet by mouth at bedtime.   Marland Kitchen HYDROcodone-acetaminophen (NORCO) 5-325 MG tablet Take 1 tablet by mouth every 6 (six) hours as needed for moderate pain. (Patient not taking: Reported on 09/21/2018)  . ibuprofen (ADVIL,MOTRIN) 200 MG tablet Take 200-400 mg by mouth 2 (two) times daily as needed for moderate pain.  . Ipratropium-Albuterol (COMBIVENT) 20-100 MCG/ACT AERS respimat Inhale 1 puff into the lungs every 6 (six) hours. (Patient not taking: Reported on 09/21/2018)  . Naphazoline-Glycerin (REDNESS RELIEF OP) Place 1 drop into both eyes daily.  . prochlorperazine (COMPAZINE) 10 MG tablet Take 1 tablet (10 mg total) by mouth every 6 (six) hours as needed for nausea or vomiting. (Patient not taking: Reported on 09/21/2018)  . Spacer/Aero-Holding Chambers (BREATHERITE COLL SPACER ADULT) MISC 1 Device by Does not apply route every 6 (six) hours. (Patient not taking: Reported on 09/21/2018)   No facility-administered encounter medications on file as of 09/21/2018.     ALLERGIES:  Allergies  Allergen Reactions  . Biaxin [Clarithromycin] Nausea And Vomiting     PHYSICAL EXAM:  ECOG Performance status: 1  Vitals:   09/21/18 0819  BP: 97/74  Pulse: (!) 110  Resp: (!) 22  Temp: 97.7 F (36.5 C)  SpO2: 97%   Filed Weights   09/21/18 0819  Weight: 82 lb (37.2 kg)    Physical Exam Constitutional:      Appearance: Normal appearance. She is normal weight.  Cardiovascular:     Rate and Rhythm: Normal rate and regular rhythm.     Heart sounds: Normal heart sounds.  Pulmonary:     Breath sounds: Wheezing present.    Musculoskeletal: Normal range of motion.  Skin:    General: Skin is warm and dry.  Neurological:     Mental Status: She is alert and oriented to person, place, and time. Mental status is at baseline.  Psychiatric:        Mood and Affect: Mood normal.        Behavior: Behavior normal.        Thought Content: Thought content normal.        Judgment: Judgment normal.      LABORATORY DATA:  I have reviewed the labs as listed.  CBC  Component Value Date/Time   WBC 10.7 (H) 09/07/2018 1158   RBC 4.65 09/07/2018 1158   HGB 12.8 09/07/2018 1158   HGB 12.5 07/02/2018 1847   HCT 40.1 09/07/2018 1158   HCT 37.3 07/02/2018 1847   PLT 407 (H) 09/07/2018 1158   PLT 407 07/02/2018 1847   MCV 86.2 09/07/2018 1158   MCV 86 07/02/2018 1847   MCH 27.5 09/07/2018 1158   MCHC 31.9 09/07/2018 1158   RDW 12.5 09/07/2018 1158   RDW 12.4 07/02/2018 1847   LYMPHSABS 1.3 09/07/2018 1158   LYMPHSABS 2.1 07/02/2018 1847   MONOABS 0.8 09/07/2018 1158   EOSABS 0.1 09/07/2018 1158   EOSABS 0.1 07/02/2018 1847   BASOSABS 0.0 09/07/2018 1158   BASOSABS 0.1 07/02/2018 1847   CMP Latest Ref Rng & Units 09/07/2018 08/17/2018 07/02/2018  Glucose 70 - 99 mg/dL 122(H) 110(H) 84  BUN 6 - 20 mg/dL 10 5(L) 6(L)  Creatinine 0.44 - 1.00 mg/dL 0.47 0.54 0.64  Sodium 135 - 145 mmol/L 136 139 141  Potassium 3.5 - 5.1 mmol/L 3.9 4.0 4.4  Chloride 98 - 111 mmol/L 96(L) 104 103  CO2 22 - 32 mmol/L 30 25 23   Calcium 8.9 - 10.3 mg/dL 9.8 9.3 9.7  Total Protein 6.5 - 8.1 g/dL 7.5 7.7 6.9  Total Bilirubin 0.3 - 1.2 mg/dL 0.4 0.6 <0.2  Alkaline Phos 38 - 126 U/L 79 89 101  AST 15 - 41 U/L 15 14(L) 10  ALT 0 - 44 U/L 10 8 6        DIAGNOSTIC IMAGING:  I have independently reviewed the scans and discussed with the patient.   I have reviewed Francene Finders, NP's note and agree with the documentation.  I personally performed a face-to-face visit, made revisions and my assessment and plan is as  follows.    ASSESSMENT & PLAN:   Adenocarcinoma of lung ([place]) 1.  Non-small cell lung cancer (Tx N3 M0), adenocarcinoma: - Patient noticed lymphadenopathy in the neck region in October 2019.  She also has night sweats but denies any fevers or significant weight loss. - Difficulty swallowing for the past few weeks.  Drinking Ensure 1 to 2 cans/day. - CT of the chest without contrast on 08/12/2018 shows bulky right hilar and mediastinal adenopathy with bilateral supraclavicular adenopathy, scattered small lung lesions nonspecific.  1.2 cm liver lesion incompletely visualized, may represent cyst, hemangioma. -Physical exam reveals bilateral supraclavicular adenopathy. - She had a biopsy of the left supra clavicular lymph node on 08/30/2018 which showed metastatic poorly differentiated adenocarcinoma consistent with lung primary.  Tumor was strongly positive for CK7 and TTF-1.  Weak positivity for Napsin-A and MOC-31. - She is not eating much.  She lost about 5 pounds in the last 2 weeks.  She eats mashed potatoes and cooked squash.  She is drinking 7-Up and cranberry juice.  She is not able to tolerate Ensure or boost because of the sweetness. - PET/CT scan on 08/23/2018 shows lymphadenopathy in the bilateral supraclavicular regions, mediastinum, subcarinal, right hilar more than left hilar with no pulmonary pathology.  No subdiaphragmatic disease seen.   -MRI of the brain without contrast on 08/24/2018 shows several small areas of nonspecific T2/flair hyperintensity in the cerebral white matter, some with mild T2 shine through on diffusion. -CT scan of the brain with and without contrast on 09/17/2018 shows no intracranial metastasis. -She was started on palliative radiation on 09/14/2018.  She cannot lay flat on the table.  She is being  treated in the left lateral position.  She reported her cough has slightly improved after radiation started. - I have called and talked to [place].  He has  initially planned her to receive 10 doses of palliative radiation. - We will plan to start her on weekly carboplatin and paclitaxel.  Once she can lie on the table comfortably, her plan will be converted to full radiation. -We talked about side effects of carboplatin and paclitaxel.  Tentatively we plan to start next week. - We talked about her continuing weight loss which will be a problem once we start chemotherapy.  We talked about placement of a feeding tube to improve her nutrition.  She is agreeable to this option. -She already has a port placed. -We will reevaluate her next week.  2.  Right shoulder pain: -She is taking hydrocodone 5 mg twice daily as needed for pain in the right scapula, radiating to the axilla and upper arm.    3.  COPD: -She is not using Combivent inhaler as prescribed. -On examination she has bilateral rhonchi and wheezes. -She was counseled to use Combivent inhaler on a daily basis.      Orders placed this encounter:  Orders Placed This Encounter  Procedures  . Magnesium  . CBC with Differential/Platelet  . Comprehensive metabolic panel  . Lactate dehydrogenase      Derek Jack, MD [place] [phone]

## 2018-09-21 NOTE — Assessment & Plan Note (Signed)
1.  Non-small cell lung cancer (Tx N3 M0), adenocarcinoma: - Patient noticed lymphadenopathy in the neck region in October 2019.  She also has night sweats but denies any fevers or significant weight loss. - Difficulty swallowing for the past few weeks.  Drinking Ensure 1 to 2 cans/day. - CT of the chest without contrast on 08/12/2018 shows bulky right hilar and mediastinal adenopathy with bilateral supraclavicular adenopathy, scattered small lung lesions nonspecific.  1.2 cm liver lesion incompletely visualized, may represent cyst, hemangioma. -Physical exam reveals bilateral supraclavicular adenopathy. - She had a biopsy of the left supra clavicular lymph node on 08/30/2018 which showed metastatic poorly differentiated adenocarcinoma consistent with lung primary.  Tumor was strongly positive for CK7 and TTF-1.  Weak positivity for Napsin-A and MOC-31. - She is not eating much.  She lost about 5 pounds in the last 2 weeks.  She eats mashed potatoes and cooked squash.  She is drinking 7-Up and cranberry juice.  She is not able to tolerate Ensure or boost because of the sweetness. - PET/CT scan on 08/23/2018 shows lymphadenopathy in the bilateral supraclavicular regions, mediastinum, subcarinal, right hilar more than left hilar with no pulmonary pathology.  No subdiaphragmatic disease seen.   -MRI of the brain without contrast on 08/24/2018 shows several small areas of nonspecific T2/flair hyperintensity in the cerebral white matter, some with mild T2 shine through on diffusion. -CT scan of the brain with and without contrast on 09/17/2018 shows no intracranial metastasis. -She was started on palliative radiation on 09/14/2018.  She cannot lay flat on the table.  She is being treated in the left lateral position.  She reported her cough has slightly improved after radiation started. - I have called and talked to [place].  He has initially planned her to receive 10 doses of palliative radiation. - We  will plan to start her on weekly carboplatin and paclitaxel.  Once she can lie on the table comfortably, her plan will be converted to full radiation. -We talked about side effects of carboplatin and paclitaxel.  Tentatively we plan to start next week. - We talked about her continuing weight loss which will be a problem once we start chemotherapy.  We talked about placement of a feeding tube to improve her nutrition.  She is agreeable to this option. -She already has a port placed. -We will reevaluate her next week.  2.  Right shoulder pain: -She is taking hydrocodone 5 mg twice daily as needed for pain in the right scapula, radiating to the axilla and upper arm.    3.  COPD: -She is not using Combivent inhaler as prescribed. -On examination she has bilateral rhonchi and wheezes. -She was counseled to use Combivent inhaler on a daily basis.

## 2018-09-21 NOTE — Progress Notes (Signed)
START ON PATHWAY REGIMEN - Non-Small Cell Lung     Administer weekly:     Paclitaxel      Carboplatin   **Always confirm dose/schedule in your pharmacy ordering system**  Patient Characteristics: Stage III - Unresectable, PS = 0, 1 AJCC T Category: TX Current Disease Status: No Distant Mets or Local Recurrence AJCC N Category: N3 AJCC M Category: M0 AJCC 8 Stage Grouping: Unknown Performance Status: PS = 0, 1 Intent of Therapy: Curative Intent, Discussed with Patient

## 2018-09-21 NOTE — Progress Notes (Signed)
Nutrition Follow up  ASSESSMENT:  63 y/o female. Active smoker w/ 40 pack year hx. Recently developed supraclavicular lymphadenopathy and associated dysphagia. PCP ordered CT chest which showed adenopathy throughout thorax w/ f/u PET confirming hypermetabolic properties. Biopsy 12/30. + for metastatic adenocarcinoma.   Pt is s/p cath placement 1/17. She was seen by Rad Onc 1/9. D/t her inability to lay flat, she is starting with only a short course of radiation, which be followed by course of chemo ONLY. The hope is to decrease the tumor burden in her chest and, once she can lay flat, deliver a more definitive course of radiation.   Per spouse/pt, pt has already received 4 radiation treatments as part of this short course, they say she will receive 6 more.   Pt says she already has noticed improvement in her work of breathing after these several doses. Subjectively, RD did note she did not seem to be as SOB today. However, her dysphagia on the other hand has not improved. She still is essentially restricted to clear liquids and still struggles taking any medications that are not in liquid form. Spouse has been trying to crush her meds and dissolve them in water/7up  Her oral intake is minimal at best. She says she is drinking 7up and juices. She is not drinking the supplements because "Im sorry.Marland KitchenMarland KitchenI just dont like them". RD asked about other intake. She notes she had some cooked/"soggy" squash the other day that she was able to tolerate. She says she had been eating mashed potatoes in gravy, but "that gets old quick".   She is 82 lbs today. She has lost 12.4 lbs (>13% bw) just since her initial appt at the cancer center 12/17. Going back a month further, she was 98.2 lbs in November. This is a loss of 16% of her BW. Per chart, her UBW appears to be 98-102 lbs which, prior to December, she had been stable at x5 years.   In regards to other symptoms, she says has not been passing many stools. Today she  says she has not had a BM in several days, though her intake has also largely only consisted of clear liquids. No diarrhea.  Last visit, she had reported instances of vomiting after eating. She had been prescribed a pill to take prior to meals to help with nausea. Today she says she has not been taking the pill regularly because "I cant swallow it". However, this does not sound to be as big of an issue as it did during our initial appt together.   During discussion, MD entered and asked pt if she would be agreeable to a feeding tube- MD noted his concerns regarding her ability to tolerate chemo given her state  Wt Readings from Last 10 Encounters:  09/21/18 82 lb (37.2 kg)  09/17/18 87 lb 15.4 oz (39.9 kg)  09/07/18 88 lb (39.9 kg)  08/24/18 91 lb 9.6 oz (41.5 kg)  08/17/18 94 lb 6.4 oz (42.8 kg)  08/10/18 95 lb (43.1 kg)  07/21/18 98 lb 3.2 oz (44.5 kg)  07/02/18 99 lb (44.9 kg)  01/19/18 100 lb 9.6 oz (45.6 kg)  03/11/17 96 lb (43.5 kg)   MEDICATIONS:  No chemotherapy as of yet Supportive meds: Hydrocodone, compazine.   LABS:  None x14 days  ANTHROPOMETRICS: Height:  Ht Readings from Last 1 Encounters:  09/17/18 '5\' 2"'$  (1.575 m)   Weight:  Wt Readings from Last 1 Encounters:  09/21/18 82 lb (37.2 kg)   BMI:  BMI Readings from Last 1 Encounters:  09/21/18 15.00 kg/m   UBW: Was 98-102 lbs x 5 years prior to December.  -12.4 lbs x1 month (94.4 at initial [facility] visit)  IBW: 50  ESTIMATED ENERGY NEEDS:  Kcal: 1500-1650 kcals (40-45 kcal/kg bw) Protein: 74-82g Pro (2-2.2g/kg bw) Fluid: >1.3 L fluid (35 ml/kg bw)  NUTRITION - FOCUSED PHYSICAL EXAM:  Unable to perform today   NUTRITION DIAGNOSIS:  Severe malnutrition related to cancer and related dysphagia/postprandial vomiting as evidenced by loss of >13% bw x1 month and an estimated intake that has met </= to 50% of needs for >/= 1 month.   DOCUMENTATION CODES:  Severe malnutrition in acute (based on above  parameters). This is likely to eventually become a chronic dx  INTERVENTION:   Pt/spouse initially hesitant regarding the idea of a feeding tube. RD spent bulk of conversation reviewing what tube feeding would look like.   RD first reviewed why this is warranted. Explained that, from what she has told RD, she is likely receiving <1000 kcals a day. Noted how she has become too weak to walk and is requiring a wheelchair. Pointed out the amount of wt she has lost even prior to starting any sort of treatment.   Explained how a PEG will allow for sufficient kcal, protein and fluid intake. It will also allow her to receive her medications, without trying to choke them down. She will almost assuredly feel better/stronger if she had sufficient intake and could take her medications.   A large concern of the pt's was whether or not she would still be able to eat orally. RD educated she is, in fact, encouraged to continue w/ oral intake, as complete cessation of oral intake can lead to atrophy of swallow muscles.   RD reviewed how PEG feeds, bolus specifically, are administered. Discussed how she can be slightly recumbent, but will not be able to lie flat during administration. RD estimated the amount of time feedings would take and number of feeds/day. Noted  much of this will depend on how much volume she can tolerate at a time. Given she has gone a prolonged length of time w/ only minimal intake, RD noted she will likely only be able to initially tolerate 2-4 oz at a time. We discussed s/s of intolerance. Reviewed what the consistency of her bowels SHOULD be - emphasized that while her stools will be looser, she should NOT have diarrhea.  Regarding the PEG itself, RD showed her an example of a PEG tube and how it will be bandaged to abdomen. We briefly discussed PEG site care and flushes as well as what to do if clogs. Emphasized that putting pureed foods through tube is not recommended. She should limit  infusions to TF formula, water and maybe oral supplements    Answered all other preliminary questions. After discussion, pt/spouse more comfortable with idea of PEG. RD noted he would provide them with detailed instructions prior to initiation of TF. Also received verbal permission to contact [facility] representative for requisition of TF supplies.   Per discussion with secretary/surgeon afterwards. Pt meeting with surgeon Thursday, w/ tentative plan to place PEG next week. Pt has a scheduled appt on Wednesday 1/29- will give more detailed education at that time.   Briefly spoke with Onc MD about potentially switching pt medications to liquid forms  GOAL:  Oral intake to meet >90% of needs, weight stability  MONITOR:  Weight, oral intake, level of dysphagia/diet tolerance, treatment plan, supplement  tolerance, PEG placement  Next Visit: Wednesday 1/29 - 8:45  Burtis Junes RD, LDN, CNSC Clinical Nutrition Available Tues-Sat via Pager: [phone] 09/21/2018 8:25 AM

## 2018-09-21 NOTE — Progress Notes (Signed)
I spoke with Suanne Marker in pathology and ordered foundation one and PD-L1 on Accession # [id number]. C34.90 Stage III

## 2018-09-21 NOTE — Patient Instructions (Signed)
[place] at [facility] Discharge Instructions  Follow up with [place] in 1 week with labs.    Thank you for choosing [place] [facility] to provide your oncology and hematology care.  To afford each patient quality time with our provider, please arrive at least 15 minutes before your scheduled appointment time.   If you have a lab appointment with the [facility] please come in thru the  Main Entrance and check in at the main information desk  You need to re-schedule your appointment should you arrive 10 or more minutes late.  We strive to give you quality time with our providers, and arriving late affects you and other patients whose appointments are after yours.  Also, if you no show three or more times for appointments you may be dismissed from the clinic at the providers discretion.     Again, thank you for choosing [facility].  Our hope is that these requests will decrease the amount of time that you wait before being seen by our physicians.       _____________________________________________________________  Should you have questions after your visit to [facility], please contact our office at [phone] between the hours of 8:00 a.m. and 4:30 p.m.  Voicemails left after 4:00 p.m. will not be returned until the following business day.  For prescription refill requests, have your pharmacy contact our office and allow 72 hours.    Cancer Center Support Programs:   > Cancer Support Group  2nd Tuesday of the month 1pm-2pm, Journey Room

## 2018-09-23 ENCOUNTER — Ambulatory Visit (INDEPENDENT_AMBULATORY_CARE_PROVIDER_SITE_OTHER): Payer: Managed Care, Other (non HMO) | Admitting: General Surgery

## 2018-09-23 ENCOUNTER — Inpatient Hospital Stay (HOSPITAL_COMMUNITY): Payer: Managed Care, Other (non HMO)

## 2018-09-23 ENCOUNTER — Encounter: Payer: Self-pay | Admitting: General Surgery

## 2018-09-23 VITALS — BP 98/68 | HR 117 | Temp 97.7°F | Resp 18 | Wt 82.0 lb

## 2018-09-23 DIAGNOSIS — R1310 Dysphagia, unspecified: Secondary | ICD-10-CM

## 2018-09-23 DIAGNOSIS — C3490 Malignant neoplasm of unspecified part of unspecified bronchus or lung: Secondary | ICD-10-CM

## 2018-09-23 MED ORDER — LIDOCAINE-PRILOCAINE 2.5-2.5 % EX CREA: TOPICAL_CREAM | CUTANEOUS | 3 refills | Status: AC

## 2018-09-23 MED ORDER — PROCHLORPERAZINE MALEATE 10 MG PO TABS: 10.0000 mg | ORAL_TABLET | Freq: Four times a day (QID) | ORAL | 1 refills | Status: DC | PRN

## 2018-09-23 NOTE — Patient Instructions (Signed)
[facility] Teaching    You have been diagnosed with Stage III non-small cell lung cancer.  You will be treated weekly with paclitaxel (Taxol) and carboplatin in conjunction with radiation therapy.  The intent of this treatment is to cure your cancer. You will see the doctor regularly throughout treatment.  We monitor your lab work prior to every treatment. The doctor monitors your response to treatment by the way you are feeling, your blood work, and scans periodically.  There will be wait times while you are here for treatment.  It will take about 30 minutes to 1 hour for your lab work to result.  Then there will be wait times while pharmacy mixes your medications.   Medications you will receive in the clinic prior to your chemotherapy medications:  Aloxi:  ALOXI is a medicine called an "antiemetic."   ALOXI is used in adults to help prevent the  nausea and vomiting that happens with certain anti-cancer medicines (chemotherapy).  Aloxi is a long acting medication, and will remain in your system for 24-36 hours.   Pepcid:  This medication is a histamine blocker that helps prevent and allergic reaction to your chemotherapy.   Dexamethasone:  This is a steroid given prior to chemotherapy to help prevent allergic reactions; it may also help prevent and control nausea and diarrhea.   Benadryl:  This is a histamine blocker (different from the Pepcid) that helps prevent allergic/infusion reactions to your chemotherapy. This medication may cause dizziness/drowsiness.   Carboplatin (Paraplatin, CBDCA)  About This Drug  Carboplatin is used to treat cancer. It is given in the vein (IV).  Possible Side Effects  . Bone marrow suppression. This is a decrease in the number of white blood cells, red blood cells, and platelets. This may raise your risk of infection, make you tired and weak (fatigue), and raise your risk of bleeding. . Nausea and vomiting (throwing up) .  Weakness . Changes in your liver function . Changes in your kidney function . Electrolyte changes . Pain  Note: Each of the side effects above was reported in 20% or greater of patients treated with carboplatin. Not all possible side effects are included above.  Warnings and Precautions  . Severe bone marrow suppression . Allergic reactions, including anaphylaxis are rare but may happen in some patients. Signs of allergic reaction to this drug may be swelling of the face, feeling like your tongue or throat are swelling, trouble breathing, rash, itching, fever, chills, feeling dizzy, and/or feeling that your heart is beating in a fast or not normal way. If this happens, do not take another dose of this drug. You should get urgent medical treatment. . Severe nausea and vomiting . Effects on the nerves are called peripheral neuropathy. This risk is increased if you are over the age of 64 or if you have received other medicine with risk of peripheral neuropathy. You may feel numbness, tingling, or pain in your hands and feet. It may be hard for you to button your clothes, open jars, or walk as usual. The effect on the nerves may get worse with more doses of the drug. These effects get better in some people after the drug is stopped but it does not get better in all people. Marland Kitchen Blurred vision, loss of vision or other changes in eyesight . Decreased hearing . Skin and tissue irritation including redness, pain, warmth, or swelling at the IV site if the drug leaks out of the vein and  into nearby tissue. . Severe changes in your kidney function, which can cause kidney failure . Severe changes in your liver function, which can cause liver failure  Note: Some of the side effects above are very rare. If you have concerns and/or questions, please discuss them with your medical team.  Important Information  . This drug may be present in the saliva, tears, sweat, urine, stool, vomit, semen, and  vaginal secretions. Talk to your doctor and/or your nurse about the necessary precautions to take during this time.  Treating Side Effects  . Manage tiredness by pacing your activities for the day. . Be sure to include periods of rest between energy-draining activities. . To decrease the risk of infection, wash your hands regularly. . Avoid close contact with people who have a cold, the flu, or other infections. . Take your temperature as your doctor or nurse tells you, and whenever you feel like you may have a fever. . To help decrease the risk of bleeding, use a soft toothbrush. Check with your nurse before using dental floss. . Be very careful when using knives or tools. . Use an electric shaver instead of a razor. . Drink plenty of fluids (a minimum of eight glasses per day is recommended). . If you throw up or have loose bowel movements, you should drink more fluids so that you do not become dehydrated (lack of water in the body from losing too much fluid). . To help with nausea and vomiting, eat small, frequent meals instead of three large meals a day. Choose foods and drinks that are at room temperature. Ask your nurse or doctor about other helpful tips and medicine that is available to help stop or lessen these symptoms. . If you have numbness and tingling in your hands and feet, be careful when cooking, walking, and handling sharp objects and hot liquids. Marland Kitchen Keeping your pain under control is important to your well-being. Please tell your doctor or nurse if you are experiencing pain.  Food and Drug Interactions . There are no known interactions of carboplatin with food. . This drug may interact with other medicines. Tell your doctor and pharmacist about all the prescription and over-the-counter medicines and dietary supplements (vitamins, minerals, herbs and others) that you are taking at this time. Also, check with your doctor or pharmacist before starting any new  prescription or over-the-counter medicines, or dietary supplements to make sure that there are no interactions.  When to Call the Doctor  Call your doctor or nurse if you have any of these symptoms and/or any new or unusual symptoms: . Fever of 100.4 F (38 C) or higher . Chills . Tiredness that interferes with your daily activities . Feeling dizzy or lightheaded . Easy bleeding or bruising . Nausea that stops you from eating or drinking and/or is not relieved by prescribed medicines . Throwing up more than 3 times a day . Blurred vision or other changes in eyesight . Decrease in hearing or ringing in the ear . Signs of allergic reaction: swelling of the face, feeling like your tongue or throat are swelling, trouble breathing, rash, itching, fever, chills, feeling dizzy, and/or feeling that your heart is beating in a fast or not normal way. If this happens, call 911 for emergency care. . While you are getting this drug, please tell your nurse right away if you have any pain, redness, or swelling at the site of the IV infusion . Signs of possible liver problems: dark urine, pale bowel  movements, bad stomach pain, feeling very tired and weak, unusual itching, or yellowing of the eyes or skin . Decreased urine, or very dark urine . Numbness, tingling, or pain in your hands and feet . Pain that does not go away or is not relieved by prescribed medicine . If you think you may be pregnant  Reproduction Warnings  . Pregnancy warning: This drug may have harmful effects on the unborn baby. Women of child bearing potential should use effective methods of birth control during your cancer treatment. Let your doctor know right away if you think you may be pregnant. . Breastfeeding warning: It is not known if this drug passes into breast milk. For this reason, women should not breastfeed during treatment because this drug could enter the breast milk and cause harm to a breastfeeding baby. .  Fertility warning: Human fertility studies have not been done with this drug. Talk with your doctor or nurse if you plan to have children. Ask for information on sperm or egg banking.  Paclitaxel (Taxol)  Paclitaxel is a drug used to treat cancer. It is given in the vein (IV).  This will take 1 hour to infuse.  The first time this drug is given it will take longer to infuse because it is increased slowly to monitor for reactions.  The nurse will be in the room with you for the first 15 minutes.   Possible Side Effects  . Hair loss. Hair loss is often temporary, although with certain medicine, hair loss can sometimes be permanent. Hair loss may happen suddenly or gradually. If you lose hair, you may lose it from your head, face, armpits, pubic area, chest, and/or legs. You may also notice your hair getting thin. . Swelling of your legs, ankles and/or feet (edema) . Flushing . Nausea and throwing up (vomiting) . Loose bowel movements (diarrhea) . Bone marrow depression. This is a decrease in the number of white blood cells, red blood cells, and platelets. This may raise your risk of infection, make you tired and weak (fatigue), and raise your risk of bleeding. . Effects on the nerves are called peripheral neuropathy. You may feel numbness, tingling, or pain in your hands and feet. It may be hard for you to button your clothes, open jars, or walk as usual. The effect on the nerves may get worse with more doses of the drug. These effects get better in some people after the drug is stopped but it does not get better in all people. . Changes in your liver function . Bone, joint and muscle pain . Abnormal EKG . Allergic reaction: Allergic reactions, including anaphylaxis are rare but may happen in some patients. Signs of allergic reaction to this drug may be swelling of the face, feeling like your tongue or throat are swelling, trouble breathing, rash, itching, fever, chills, feeling dizzy, and/or  feeling that your heart is beating in a fast or not normal way. If this happens, do not take another dose of this drug. You should get urgent medical treatment. . Infection . Changes in your kidney function.  Note: Each of the side effects above was reported in 20% or greater of patients treated with paclitaxel. Not all possible side effects are included above.  Warnings and Precautions  . Severe bone marrow depression  Side Effects  . To help with hair loss, wash with a mild shampoo and avoid washing your hair every day. . Avoid rubbing your scalp, instead, pat your hair or scalp dry .  Avoid coloring your hair . Limit your use of hair spray, electric curlers, blow dryers, and curling irons. . If you are interested in getting a wig, talk to your nurse. You can also call the [facility] at [phone] to find out information about the "Look Good, Feel Better" program close to where you live. It is a free program where women getting chemotherapy can learn about wigs, turbans and scarves as well as makeup techniques and skin and nail care. . Ask your doctor or nurse about medicines that are available to help stop or lessen diarrhea and/or nausea. . To help with nausea and vomiting, eat small, frequent meals instead of three large meals a day. Choose foods and drinks that are at room temperature. Ask your nurse or doctor about other helpful tips and medicine that is available to help or stop lessen these symptoms. . If you get diarrhea, eat low-fiber foods that are high in protein and calories and avoid foods that can irritate your digestive tracts or lead to cramping. Ask your nurse or doctor about medicine that can lessen or stop your diarrhea. . Mouth care is very important. Your mouth care should consist of routine, gentle cleaning of your teeth or dentures and rinsing your mouth with a mixture of 1/2 teaspoon of salt in 8 ounces of water or  teaspoon of baking soda in 8 ounces of  water. This should be done at least after each meal and at bedtime. . If you have mouth sores, avoid mouthwash that has alcohol. Also avoid alcohol and smoking because they can bother your mouth and throat. . Drink plenty of fluids (a minimum of eight glasses per day is recommended). . Take your temperature as your doctor or nurse tells you, and whenever you feel like you may have a fever. . Talk to your doctor or nurse about precautions you can take to avoid infections and bleeding. . Be careful when cooking, walking, and handling sharp objects and hot liquids.  Food and Drug Interactions  . There are no known interactions of paclitaxel with food. . This drug may interact with other medicines. Tell your doctor and pharmacist about all the medicines and dietary supplements (vitamins, minerals, herbs and others) that you are taking at this time. . The safety and use of dietary supplements and alternative diets are often not known. Using these might affect your cancer or interfere with your treatment. Until more is known, you should not use dietary supplements or alternative diets without your cancer doctor's help.  When to Call the Doctor Call your doctor or nurse if you have any of the following symptoms and/or any new or unusual symptoms: . Fever of 100.5 F (38 C) or above . Chills . Redness, pain, warmth, or swelling at the IV site during the infusion . Signs of allergic reaction: swelling of the face, feeling like your tongue or throat are swelling, trouble breathing, rash, itching, fever, chills, feeling dizzy, and/or feeling that your heart is beating in a fast or not normal way . Feeling that your heart is beating in a fast or not normal way (palpitations) . Weight gain of 5 pounds in one week (fluid retention) . Decreased urine or very dark urine . Signs of liver problems: dark urine, pale bowel movements, bad stomach pain, feeling very tired and weak, unusual itching, or yellowing of  the eyes or skin . Heavy menstrual period that lasts longer than normal . Easy bruising or bleeding . Nausea that  stops you from eating or drinking, and/or that is not relieved by prescribed medicines. . Loose bowel movements (diarrhea) more than 4 times a day or diarrhea with weakness or lightheadedness . Pain in your mouth or throat that makes it hard to eat or drink . Lasting loss of appetite or rapid weight loss of five pounds in a week . Signs of peripheral neuropathy: numbness, tingling, or decreased feeling in fingers or toes; trouble walking or changes in the way you walk; or feeling clumsy when buttoning clothes, opening jars, or other routine activities . Joint and muscle pain that is not relieved by prescribed medicines . Extreme fatigue that interferes with normal activities . While you are getting this drug, please tell your nurse right away if you have any pain, redness, or swelling at the site of the IV infusion. . If you think you are pregnant.  Reproduction Warnings  . Pregnancy warning: This drug may have harmful effects on the unborn child, it is recommended that effective methods of birth control should be used during your cancer treatment. Let your doctor know right away if you think you may be pregnant. . Breast feeding warning: Women should not breast feed during treatment because this drug could enter the breast milk and cause harm to a breast feeding baby.  SELF CARE ACTIVITIES WHILE ON CHEMOTHERAPY:  Hydration  Increase your fluid intake 48 hours prior to treatment and drink at least 8 to 12 cups (64 ounces) of water/decaffeinated beverages per day after treatment. You can still have your cup of coffee or soda but these beverages do not count as part of your 8 to 12 cups that you need to drink daily. No alcohol intake.  Medications  Continue taking your normal prescription medication as prescribed.  If you start any new herbal or new supplements please let us know  first to make sure it is safe.  Mouth Care  Have teeth cleaned professionally before starting treatment. Keep dentures and partial plates clean. Use soft toothbrush and do not use mouthwashes that contain alcohol. Biotene is a good mouthwash that is available at most pharmacies or may be ordered by calling [phone]. Use warm salt water gargles (1 teaspoon salt per 1 quart warm water) before and after meals and at bedtime. Or you may rinse with 2 tablespoons of three-percent hydrogen peroxide mixed in eight ounces of water. If you are still having problems with your mouth or sores in your mouth please call the clinic. If you need dental work, please let the doctor know before you go for your appointment so that we can coordinate the best possible time for you in regards to your chemo regimen. You need to also let your dentist know that you are actively taking chemo. We may need to do labs prior to your dental appointment.  Skin Care  Always use sunscreen that has not expired and with SPF (Sun Protection Factor) of 50 or higher. Wear hats to protect your head from the sun. Remember to use sunscreen on your hands, ears, face, & feet.  Use good moisturizing lotions such as udder cream, eucerin, or even Vaseline. Some chemotherapies can cause dry skin, color changes in your skin and nails.    . Avoid long, hot showers or baths. . Use gentle, fragrance-free soaps and laundry detergent. . Use moisturizers, preferably creams or ointments rather than lotions because the thicker consistency is better at preventing skin dehydration. Apply the cream or ointment within 15 minutes of  showering. Reapply moisturizer at night, and moisturize your hands every time after you wash them.  Hair Loss (if your doctor says your hair will fall out)  . If your doctor says that your hair is likely to fall out, decide before you begin chemo whether you want to wear a wig. You may want to shop before treatment to match  your hair color. . Hats, turbans, and scarves can also camouflage hair loss, although some people prefer to leave their heads uncovered. If you go bare-headed outdoors, be sure to use sunscreen on your scalp. . Cut your hair short. It eases the inconvenience of shedding lots of hair, but it also can reduce the emotional impact of watching your hair fall out. . Don't perm or color your hair during chemotherapy. Those chemical treatments are already damaging to hair and can enhance hair loss. Once your chemo treatments are done and your hair has grown back, it's OK to resume dyeing or perming hair. With chemotherapy, hair loss is almost always temporary. But when it grows back, it may be a different color or texture. In older adults who still had hair color before chemotherapy, the new growth may be completely gray.  Often, new hair is very fine and soft.  Infection Prevention  Please wash your hands for at least 30 seconds using warm soapy water. Handwashing is the #1 way to prevent the spread of germs. Stay away from sick people or people who are getting over a cold. If you develop respiratory systems such as green/yellow mucus production or productive cough or persistent cough let us know and we will see if you need an antibiotic. It is a good idea to keep a pair of gloves on when going into grocery stores/[employer] to decrease your risk of coming into contact with germs on the carts, etc. Carry alcohol hand gel with you at all times and use it frequently if out in public. If your temperature reaches 100.5 or higher please call the clinic and let us know.  If it is after hours or on the weekend please go to the ER if your temperature is over 100.5.  Please have your own personal thermometer at home to use.    Sex and bodily fluids  If you are going to have sex, a condom must be used to protect the person that isn't taking chemotherapy. Chemo can decrease your libido (sex drive). For a few days after  chemotherapy, chemotherapy can be excreted through your bodily fluids.  When using the toilet please close the lid and flush the toilet twice.  Do this for a few day after you have had chemotherapy.   Effects of chemotherapy on your sex life  Some changes are simple and won't last long. They won't affect your sex life permanently. Sometimes you may feel: . too tired . not strong enough to be very active . sick or sore  . not in the mood . anxious or low Your anxiety might not seem related to sex. For example, you may be worried about the cancer and how your treatment is going. Or you may be worried about money, or about how you family are coping with your illness. These things can cause stress, which can affect your interest in sex. It's important to talk to your partner about how you feel. Remember - the changes to your sex life don't usually last long. There's usually no medical reason to stop having sex during chemo. The drugs won't have any  long term physical effects on your performance or enjoyment of sex. Cancer can't be passed on to your partner during sex  Contraception  It's important to use reliable contraception during treatment. Avoid getting pregnant while you or your partner are having chemotherapy. This is because the drugs may harm the baby. Sometimes chemotherapy drugs can leave a man or woman infertile.  This means you would not be able to have children in the future. You might want to talk to someone about permanent infertility. It can be very difficult to learn that you may no longer be able to have children. Some people find counselling helpful. There might be ways to preserve your fertility, although this is easier for men than for women. You may want to speak to a fertility expert. You can talk about sperm banking or harvesting your eggs. You can also ask about other fertility options, such as donor eggs. If you have or have had breast cancer, your doctor might advise you  not to take the contraceptive pill. This is because the hormones in it might affect the cancer.  It is not known for sure whether or not chemotherapy drugs can be passed on through semen or secretions from the vagina. Because of this some doctors advise people to use a barrier method if you have sex during treatment. This applies to vaginal, anal or oral sex. Generally, doctors advise a barrier method only for the time you are actually having the treatment and for about a week after your treatment. Advice like this can be worrying, but this does not mean that you have to avoid being intimate with your partner. You can still have close contact with your partner and continue to enjoy sex.  Animals  If you have cats or birds we just ask that you not change the litter or change the cage.  Please have someone else do this for you while you are on chemotherapy.   Food Safety During and After Cancer Treatment  Food safety is important for people both during and after cancer treatment. Cancer and cancer treatments, such as chemotherapy, radiation therapy, and stem cell/bone marrow transplantation, often weaken the immune system. This makes it harder for your body to protect itself from foodborne illness, also called food poisoning. Foodborne illness is caused by eating food that contains harmful bacteria, parasites, or viruses.  Foods to avoid  Some foods have a higher risk of becoming tainted with bacteria. These include: Marland Kitchen Unwashed fresh fruit and vegetables, especially leafy vegetables that can hide dirt and other contaminants . Raw sprouts, such as alfalfa sprouts . Raw or undercooked beef, especially ground beef, or other raw or undercooked meat and poultry . Fatty, fried, or spicy foods immediately before or after treatment.  These can sit heavy on your stomach and make you feel nauseous. . Raw or undercooked shellfish, such as oysters. . Sushi and sashimi, which often contain raw fish.   . Unpasteurized beverages, such as unpasteurized fruit juices, raw milk, raw yogurt, or cider . Undercooked eggs, such as soft boiled, over easy, and poached; raw, unpasteurized eggs; or foods made with raw egg, such as homemade raw cookie dough and homemade mayonnaise  Simple steps for food safety  Shop smart. . Do not buy food stored or displayed in an unclean area. . Do not buy bruised or damaged fruits or vegetables. . Do not buy cans that have cracks, dents, or bulges. . Pick up foods that can spoil at the end of your  shopping trip and store them in a cooler on the way home. Prepare and clean up foods carefully. . Rinse all fresh fruits and vegetables under running water, and dry them with a clean towel or paper towel. . Clean the top of cans before opening them. . After preparing food, wash your hands for 20 seconds with hot water and soap. Pay special attention to areas between fingers and under nails. . Clean your utensils and dishes with hot water and soap. Marland Kitchen Disinfect your kitchen and cutting boards using 1 teaspoon of liquid, unscented bleach mixed into 1 quart of water.   Dispose of old food. . Eat canned and packaged food before its expiration date (the "use by" or "best before" date). . Consume refrigerated leftovers within 3 to 4 days. After that time, throw out the food. Even if the food does not smell or look spoiled, it still may be unsafe. Some bacteria, such as Listeria, can grow even on foods stored in the refrigerator if they are kept for too long. Take precautions when eating out. . At restaurants, avoid buffets and salad bars where food sits out for a long time and comes in contact with many people. Food can become contaminated when someone with a virus, often a norovirus, or another "bug" handles it. . Put any leftover food in a "to-go" container yourself, rather than having the server do it. And, refrigerate leftovers as soon as you get home. . Choose restaurants  that are clean and that are willing to prepare your food as you order it cooked.   MEDICATIONS:                                                                                                                                                                Compazine/Prochlorperazine 10mg  tablet. Take 1 tablet every 6 hours as needed for nausea/vomiting. (This can make you sleepy)   EMLA cream. Apply a quarter size amount to port site 1 hour prior to chemo. Do not rub in. Cover with plastic wrap.   Over-the-Counter Meds:  Colace - 100 mg capsules - take 2 capsules daily.  If this doesn't help then you can increase to 2 capsules twice daily.  Call us if this does not help your bowels move.   Imodium 2mg  capsule. Take 2 capsules after the 1st loose stool and then 1 capsule every 2 hours until you go a total of 12 hours without having a loose stool. Call the [facility] if loose stools continue. If diarrhea occurs at bedtime, take 2 capsules at bedtime. Then take 2 capsules every 4 hours until morning. Call [facility].    Diarrhea Sheet   If you are having loose stools/diarrhea, please purchase Imodium and begin taking as outlined:  At  the first sign of poorly formed or loose stools you should begin taking Imodium (loperamide) 2 mg capsules.  Take two caplets (4mg ) followed by one caplet (2mg ) every 2 hours until you have had no diarrhea for 12 hours.  During the night take two caplets (4mg ) at bedtime and continue every 4 hours during the night until the morning.  Stop taking Imodium only after there is no sign of diarrhea for 12 hours.    Always call the [facility] if you are having loose stools/diarrhea that you can't get under control.  Loose stools/diarrhea leads to dehydration (loss of water) in your body.  We have other options of trying to get the loose stools/diarrhea to stop but you must let us know!   Constipation Sheet  Colace - 100 mg capsules - take 2 capsules daily.   If this doesn't help then you can increase to 2 capsules twice daily.  Please call if the above does not work for you.   Do not go more than 2 days without a bowel movement.  It is very important that you do not become constipated.  It will make you feel sick to your stomach (nausea) and can cause abdominal pain and vomiting.   Nausea Sheet   Compazine/Prochlorperazine 10mg  tablet. Take 1 tablet every 6 hours as needed for nausea/vomiting. (This can make you sleepy)  If you are having persistent nausea (nausea that does not stop) please call the [facility] and let us know the amount of nausea that you are experiencing.  If you begin to vomit, you need to call the [facility] and if it is the weekend and you have vomited more than one time and can't get it to stop-go to the Emergency Room.  Persistent nausea/vomiting can lead to dehydration (loss of fluid in your body) and will make you feel terrible.   Ice chips, sips of clear liquids, foods that are @ room temperature, crackers, and toast tend to be better tolerated.   SYMPTOMS TO REPORT AS SOON AS POSSIBLE AFTER TREATMENT:   FEVER GREATER THAN 100.5 F  CHILLS WITH OR WITHOUT FEVER  NAUSEA AND VOMITING THAT IS NOT CONTROLLED WITH YOUR NAUSEA MEDICATION  UNUSUAL SHORTNESS OF BREATH  UNUSUAL BRUISING OR BLEEDING  TENDERNESS IN MOUTH AND THROAT WITH OR WITHOUT PRESENCE OF ULCERS  URINARY PROBLEMS  BOWEL PROBLEMS  UNUSUAL RASH      Wear comfortable clothing and clothing appropriate for easy access to any Portacath or PICC line. Let us know if there is anything that we can do to make your therapy better!    What to do if you need assistance after hours or on the weekends: CALL [phone].  HOLD on the line, do not hang up.  You will hear multiple messages but at the end you will be connected with a nurse triage line.  They will contact the doctor if necessary.  Most of the time they will be able to assist you.  Do not  call the hospital operator.      I have been informed and understand all of the instructions given to me and have received a copy. I have been instructed to call the clinic [phone] or my family physician as soon as possible for continued medical care, if indicated. I do not have any more questions at this time but understand that I may call the [facility] or the Patient Navigator at [phone] during office hours should I have questions or  need assistance in obtaining follow-up care.

## 2018-09-23 NOTE — Patient Instructions (Signed)
PEG Tube Home Guide  A percutaneous endoscopic gastrostomy (PEG) tube is used to deliver food and fluids directly into the stomach. The tube has a clamp, a cap, and two anchors (bolsters). One bolster keeps the tube from coming out of the stomach. The other bolster holds the tube against the abdomen. You will be taught how to use and adjust your PEG tube before you leave the hospital. You will also be taught how to care for the opening (stoma) in your abdomen. Make sure that you understand:  How to care for your PEG tube.  How to care for your stoma.  How to give yourself feedings and medicines.  When to call your health care provider for help. Supplies needed:  [employer], plain water  Clean washcloth  Gauze pad  Syringe How to care for a PEG tube  Check your PEG tube every day. Make sure: ? It is not too tight. The bolster should rest gently over the stoma. ? It is in the correct position. There is a Erika Turner on the tube that shows when it is in the correct position. Adjust the tube if you need to. Cleaning your stoma Clean your stoma every day. Follow these steps: 1. Wash your hands with soap and water. If soap and water are not available, use hand sanitizer. 2. Check the skin around the stoma for redness, rash, swelling, drainage, or extra tissue growth. If you notice any of these, call your health care provider. 3. Wash the stoma and the skin around it using a clean, soft washcloth. Clean using a circular motion, and wipe away from the stoma opening, not toward it. ? Use warm, soapy water, and only use cleansers recommended by your health care provider. ? Rinse the stoma area with plain water. ? Pat the stoma area dry. 4. You may place a gauze pad over the opening between the outer bolster and your stoma.  Giving a feeding Your health care provider will give you instructions about:  How much nutrition and fluid you will need for each feeding.  How often to have a  feeding.  Whether to take medicine in the tube by itself or with a feeding. To give yourself a feeding, follow these steps: 1. Lay out all of the equipment that you will need. 2. Make sure that the nutritional formula is at room temperature. 3. Wash your hands with soap and water. 4. Position yourself so that you are upright. You will need to stay upright throughout the feeding and for at least 30 minutes after the feeding. 5. Make sure the syringe plunger is pushed in. Place the tip of the syringe in clean water, and slowly pull the plunger to bring (draw up) the water into the syringe. 6. Remove the clamp and the cap from the PEG tube. 7. Push the water out of the syringe to clean (flush) the tube. 8. If the tube is clear, draw up the formula into the syringe. Make sure to use the right amount for each feeding and add water if necessary. 9. Slowly push the formula from the syringe through the tube. 10. After the feeding, flush the tube with water. 11. Put the clamp and the cap on the tube. Giving medicine To give yourself medicine, follow these steps: 1. Lay out all of the equipment that you will need. 2. If your medicine is in tablet form, crush the tablet and dissolve it in water. 3. Wash your hands with soap and water.  4. Position yourself so that you are upright. You will need to stay upright while you give yourself medicine and for at least 30 minutes afterward. 5. Make sure the syringe plunger is pushed in. Place the tip of the syringe in clean water, and slowly pull the plunger to bring (draw up) the water into the syringe. 6. Remove the clamp and the cap from the PEG tube. 7. Push the water out of the syringe to clean (flush) the tube. 8. If the tube is clear, draw up the medicine into the syringe. 9. Slowly push the medicine from the syringe through the tube. 10. Flush the tube with water. 11. Put the clamp and the cap on the tube. Do not take sustained release (SR) medicines  through your tube. If you are unsure if your medicine is a SR medicine, ask your health care provider or pharmacist. Contact a health care provider if you have:  Soreness, redness, or irritation around your stoma.  Abdominal pain or bloating during or after your feedings.  Nausea, constipation, or diarrhea that will not go away.  A fever.  Problems with your PEG tube. Get help right away if:  Your tube is blocked.  Your tube falls out.  You have pain around your stoma.  You are bleeding from your stoma.  Your tube is leaking.  You choke or you have trouble breathing during or after a feeding. Summary  A percutaneous endoscopic gastrostomy (PEG) tube is used to deliver food and fluids directly into the stomach.  You will be taught how to use and adjust your PEG tube. You will also be taught how to care for the stoma in your abdomen.  Your health care provider will give you instructions on how to give yourself nutritional formula and medicines through your PEG tube.  Contact your health care provider if you have a fever or soreness, redness, or irritation around your stoma.  Get help right away if your tube leaks, is blocked, or falls out. Get help right away if you have pain or bleeding around your stoma. This information is not intended to replace advice given to you by your health care provider. Make sure you discuss any questions you have with your health care provider. Document Released: 01/02/2015 Document Revised: 08/31/2017 Document Reviewed: 08/31/2017 Elsevier Interactive Patient Education  2019 [employer].

## 2018-09-24 NOTE — H&P (Signed)
Erika Turner; [id number]; 12-May-1956   HPI 63 year old white female who was referred back to my care by Dr. Delton Coombes for PEG placement.  She was recently diagnosed with metastatic adenocarcinoma of the lung and has had significant weight loss due to her inability to maintain oral nutrition.  She has been having some dysphasia and has developed protein deficiency malnutrition.  She currently weighs 82 pounds.  She has 3 out of 10 abdominal pain. Past Medical History:  Diagnosis Date  . GAD (generalized anxiety disorder) 01/21/2017  . Lymphadenopathy   . Underweight 01/21/2017    Past Surgical History:  Procedure Laterality Date  . ABDOMINAL HYSTERECTOMY    . CESAREAN SECTION     x 2  . LYMPH NODE BIOPSY Left 08/30/2018   Procedure: CERVICAL LYMPH NODE BIOPSY;  Surgeon: Aviva Signs, MD;  Location: [facility];  Service: General;  Laterality: Left;  . PORTACATH PLACEMENT Right 09/17/2018   Procedure: INSERTION PORT-A-CATH (ATTACHED CATHETER IN RIGHT SUBCLAVIAN);  Surgeon: Aviva Signs, MD;  Location: [facility];  Service: General;  Laterality: Right;    Family History  Problem Relation Age of Onset  . Diabetes Mother   . Heart disease Mother        Pacemaker   . Cancer Father        Lung  . Cancer Brother        bladder then colon  . Down syndrome Brother   . Mental illness Daughter     Current Outpatient Medications on File Prior to Visit  Medication Sig Dispense Refill  . diphenhydramine-acetaminophen (TYLENOL PM) 25-500 MG TABS tablet Take 1 tablet by mouth at bedtime.     Marland Kitchen escitalopram (LEXAPRO) 10 MG tablet Take 1 tablet (10 mg total) by mouth daily. 90 tablet 3  . HYDROcodone-acetaminophen (NORCO) 5-325 MG tablet Take 1 tablet by mouth every 6 (six) hours as needed for moderate pain. (Patient not taking: Reported on 09/21/2018) 30 tablet 0  . ibuprofen (ADVIL,MOTRIN) 200 MG tablet Take 200-400 mg by mouth 2 (two) times daily as needed for moderate pain.    . Ipratropium-Albuterol  (COMBIVENT) 20-100 MCG/ACT AERS respimat Inhale 1 puff into the lungs every 6 (six) hours. (Patient not taking: Reported on 09/21/2018) 4 g 2  . Naphazoline-Glycerin (REDNESS RELIEF OP) Place 1 drop into both eyes daily.    . prochlorperazine (COMPAZINE) 10 MG tablet Take 1 tablet (10 mg total) by mouth every 6 (six) hours as needed for nausea or vomiting. (Patient not taking: Reported on 09/21/2018) 30 tablet 0  . Spacer/Aero-Holding Chambers (BREATHERITE COLL SPACER ADULT) MISC 1 Device by Does not apply route every 6 (six) hours. (Patient not taking: Reported on 09/21/2018) 1 each 0   No current facility-administered medications on file prior to visit.     Allergies  Allergen Reactions  . Biaxin [Clarithromycin] Nausea And Vomiting    Social History   Substance and Sexual Activity  Alcohol Use No    Social History   Tobacco Use  Smoking Status Current Every Day Smoker  . Packs/day: 1.00  . Years: 40.00  . Pack years: 40.00  . Types: Cigarettes  . Start date: 11/02/1978  Smokeless Tobacco Never Used    Review of Systems  Constitutional: Positive for malaise/fatigue and weight loss.  HENT: Positive for sore throat.   Eyes: Negative.   Respiratory: Positive for shortness of breath.   Cardiovascular: Negative.   Gastrointestinal: Negative.   Genitourinary: Negative.   Musculoskeletal: Positive for back pain.  Skin: Negative.   Neurological: Negative.   Endo/Heme/Allergies: Negative.   Psychiatric/Behavioral: Negative.     Objective   Vitals:   09/23/18 1059  BP: 98/68  Pulse: (!) 117  Resp: 18  Temp: 97.7 F (36.5 C)    Physical Exam Constitutional:      General: She is not in acute distress.    Appearance: Normal appearance.  HENT:     Head: Normocephalic and atraumatic.  Cardiovascular:     Rate and Rhythm: Normal rate and regular rhythm.     Heart sounds: Normal heart sounds. No murmur. No friction rub. No gallop.   Pulmonary:     Effort: Pulmonary  effort is normal. No respiratory distress.     Breath sounds: Normal breath sounds. No stridor. No wheezing, rhonchi or rales.  Abdominal:     General: Abdomen is flat. Bowel sounds are normal. There is no distension.     Palpations: Abdomen is soft. There is no mass.     Tenderness: There is no abdominal tenderness. There is no guarding.     Hernia: No hernia is present.  Skin:    General: Skin is warm and dry.     Coloration: Skin is pale.  Neurological:     Mental Status: She is alert and oriented to person, place, and time.     Assessment  Weight loss, dysphasia, metastatic adenocarcinoma of the lung Plan   Patient is scheduled for EGD with PEG on 10/01/2018.  The risks and benefits of the procedure including bleeding, infection, and organ perforation were fully explained to the patient, who gave informed consent.

## 2018-09-24 NOTE — Progress Notes (Signed)
Erika Turner; [id number]; 08-16-1956   HPI 63 year old white female who was referred back to my care by Dr. Delton Coombes for PEG placement.  She was recently diagnosed with metastatic adenocarcinoma of the lung and has had significant weight loss due to her inability to maintain oral nutrition.  She has been having some dysphasia and has developed protein deficiency malnutrition.  She currently weighs 82 pounds.  She has 3 out of 10 abdominal pain. Past Medical History:  Diagnosis Date  . GAD (generalized anxiety disorder) 01/21/2017  . Lymphadenopathy   . Underweight 01/21/2017    Past Surgical History:  Procedure Laterality Date  . ABDOMINAL HYSTERECTOMY    . CESAREAN SECTION     x 2  . LYMPH NODE BIOPSY Left 08/30/2018   Procedure: CERVICAL LYMPH NODE BIOPSY;  Surgeon: Aviva Signs, MD;  Location: [facility];  Service: General;  Laterality: Left;  . PORTACATH PLACEMENT Right 09/17/2018   Procedure: INSERTION PORT-A-CATH (ATTACHED CATHETER IN RIGHT SUBCLAVIAN);  Surgeon: Aviva Signs, MD;  Location: [facility];  Service: General;  Laterality: Right;    Family History  Problem Relation Age of Onset  . Diabetes Mother   . Heart disease Mother        Pacemaker   . Cancer Father        Lung  . Cancer Brother        bladder then colon  . Down syndrome Brother   . Mental illness Daughter     Current Outpatient Medications on File Prior to Visit  Medication Sig Dispense Refill  . diphenhydramine-acetaminophen (TYLENOL PM) 25-500 MG TABS tablet Take 1 tablet by mouth at bedtime.     Marland Kitchen escitalopram (LEXAPRO) 10 MG tablet Take 1 tablet (10 mg total) by mouth daily. 90 tablet 3  . HYDROcodone-acetaminophen (NORCO) 5-325 MG tablet Take 1 tablet by mouth every 6 (six) hours as needed for moderate pain. (Patient not taking: Reported on 09/21/2018) 30 tablet 0  . ibuprofen (ADVIL,MOTRIN) 200 MG tablet Take 200-400 mg by mouth 2 (two) times daily as needed for moderate pain.    . Ipratropium-Albuterol  (COMBIVENT) 20-100 MCG/ACT AERS respimat Inhale 1 puff into the lungs every 6 (six) hours. (Patient not taking: Reported on 09/21/2018) 4 g 2  . Naphazoline-Glycerin (REDNESS RELIEF OP) Place 1 drop into both eyes daily.    . prochlorperazine (COMPAZINE) 10 MG tablet Take 1 tablet (10 mg total) by mouth every 6 (six) hours as needed for nausea or vomiting. (Patient not taking: Reported on 09/21/2018) 30 tablet 0  . Spacer/Aero-Holding Chambers (BREATHERITE COLL SPACER ADULT) MISC 1 Device by Does not apply route every 6 (six) hours. (Patient not taking: Reported on 09/21/2018) 1 each 0   No current facility-administered medications on file prior to visit.     Allergies  Allergen Reactions  . Biaxin [Clarithromycin] Nausea And Vomiting    Social History   Substance and Sexual Activity  Alcohol Use No    Social History   Tobacco Use  Smoking Status Current Every Day Smoker  . Packs/day: 1.00  . Years: 40.00  . Pack years: 40.00  . Types: Cigarettes  . Start date: 11/02/1978  Smokeless Tobacco Never Used    Review of Systems  Constitutional: Positive for malaise/fatigue and weight loss.  HENT: Positive for sore throat.   Eyes: Negative.   Respiratory: Positive for shortness of breath.   Cardiovascular: Negative.   Gastrointestinal: Negative.   Genitourinary: Negative.   Musculoskeletal: Positive for back pain.  Skin: Negative.   Neurological: Negative.   Endo/Heme/Allergies: Negative.   Psychiatric/Behavioral: Negative.     Objective   Vitals:   09/23/18 1059  BP: 98/68  Pulse: (!) 117  Resp: 18  Temp: 97.7 F (36.5 C)    Physical Exam Constitutional:      General: She is not in acute distress.    Appearance: Normal appearance.  HENT:     Head: Normocephalic and atraumatic.  Cardiovascular:     Rate and Rhythm: Normal rate and regular rhythm.     Heart sounds: Normal heart sounds. No murmur. No friction rub. No gallop.   Pulmonary:     Effort: Pulmonary  effort is normal. No respiratory distress.     Breath sounds: Normal breath sounds. No stridor. No wheezing, rhonchi or rales.  Abdominal:     General: Abdomen is flat. Bowel sounds are normal. There is no distension.     Palpations: Abdomen is soft. There is no mass.     Tenderness: There is no abdominal tenderness. There is no guarding.     Hernia: No hernia is present.  Skin:    General: Skin is warm and dry.     Coloration: Skin is pale.  Neurological:     Mental Status: She is alert and oriented to person, place, and time.     Assessment  Weight loss, dysphasia, metastatic adenocarcinoma of the lung Plan   Patient is scheduled for EGD with PEG on 10/01/2018.  The risks and benefits of the procedure including bleeding, infection, and organ perforation were fully explained to the patient, who gave informed consent.

## 2018-09-27 ENCOUNTER — Other Ambulatory Visit (HOSPITAL_COMMUNITY): Payer: Managed Care, Other (non HMO)

## 2018-09-27 ENCOUNTER — Encounter (HOSPITAL_COMMUNITY)
Admission: RE | Admit: 2018-09-27 | Discharge: 2018-09-27 | Disposition: A | Discharge status: Home / Self Care | Payer: Managed Care, Other (non HMO) | Source: Ambulatory Visit | Attending: General Surgery | Admitting: General Surgery

## 2018-09-27 ENCOUNTER — Ambulatory Visit (HOSPITAL_COMMUNITY): Payer: Managed Care, Other (non HMO) | Admitting: Hematology

## 2018-09-28 ENCOUNTER — Encounter (HOSPITAL_COMMUNITY): Payer: Self-pay | Admitting: Dietician

## 2018-09-28 NOTE — Progress Notes (Signed)
Nutrition Follow up  ASSESSMENT:  63 y/o female. Active smoker w/ 40 pack year hx. Recently developed supraclavicular lymphadenopathy and associated dysphagia. PCP ordered CT chest which showed adenopathy throughout thorax w/ f/u PET confirming hypermetabolic properties. Biopsy 12/30. + for metastatic adenocarcinoma. Begun short 10-dose course pallaitive radiation 1/14. Start Chemo: 1/29  At last visit, MD broached placement of a FT with pt. While initially hesitant, she agreed it is in her best interest. Seen by surgeon 1/23. Planned for placement 1/31. RD meeting today to review prescribed TF regimen, demonstrate feeding with PEG model and answer questions. [facility] also to meet with patient.   Pt has lost yet another 6 lbs- she is 76 lbs today. Weight continues declining rapidly. She has lost 18.4 lbs (>19% bw) just since her initial appt at the cancer center 12/17. Going back a month further, she was 98.2 lbs in November. This is a loss of 22.2 lbs or 22.6% of her BW. Per chart, her UBW appears to be 98-102 lbs which, prior to December, she had been stable at x5 years.   She has finished her short, palliative 10-dose course of radiation. Since the radiation, she has noticed substantial improvement in her SOB, but unfortunately has not had any improvement in her dysphagia.   In regards to other symptoms. She has intermittent nausea-"comes and goes". She feels some of this may be r/t anxiety. She does report feeling constipated and has not had a BM in several days.   She had not anticipated receiving chemotherapy today and says she has not yet prepared mentally to receive tx - as such, she declined any chemo today and wants to wait until next week. MD obliged and she will only receive IVF  Wt Readings from Last 10 Encounters:  09/29/18 76 lb (34.5 kg)  09/23/18 82 lb (37.2 kg)  09/21/18 82 lb (37.2 kg)  09/17/18 87 lb 15.4 oz (39.9 kg)  09/07/18 88 lb (39.9 kg)  08/24/18 91 lb 9.6 oz (41.5 kg)   08/17/18 94 lb 6.4 oz (42.8 kg)  08/10/18 95 lb (43.1 kg)  07/21/18 98 lb 3.2 oz (44.5 kg)  07/02/18 99 lb (44.9 kg)   MEDICATIONS:  Chemotherapy: Weekly carboplatin and paclitaxel Supportive meds: Hydrocodone, compazine.   LABS:  Na: 133 - will receive IVF, Albumin 3.8  Recent Labs  Lab 09/29/18 0838  NA 133*  K 3.8  CL 92*  CO2 30  BUN 9  CREATININE 0.43*  CALCIUM 9.3  MG 2.2  GLUCOSE 127*   ANTHROPOMETRICS: Height:  Ht Readings from Last 1 Encounters:  09/17/18 '5\' 2"'$  (1.575 m)   Weight:  Wt Readings from Last 1 Encounters:  09/29/18 76 lb (34.5 kg)   BMI:  BMI Readings from Last 1 Encounters:  09/29/18 13.90 kg/m   UBW: Was 98-102 lbs x 5 years prior to December.  -18.4 lbs x1.5 month (94.4 at initial [facility] visit)  IBW: 50  ESTIMATED ENERGY NEEDS:  Kcal: 1400-1550 kcals (40-45 kcal/kg bw) Protein: >70g Pro (2 g/kg bw) Fluid: >1.3 L fluid (35 ml/kg bw)  NUTRITION DIAGNOSIS:  Severe malnutrition related to cancer and related dysphagia/postprandial vomiting as evidenced by loss of >19% bw x1.5 months and an estimated intake that has met </= to 50% of needs for >/= 1 month.   DOCUMENTATION CODES:  Severe malnutrition in acute (based on above parameters). This is likely to eventually become a chronic dx  INTERVENTION:    Initial TF regimen: 1 can Osmolite 1.5  QID w/ 30 ml Promod. +60 ml flush before / after each bolus  TF will provide: 1520 kcals, 70g Pro, 724 ml free water (+480 from flushes for total of 1204 mls h20)  RD explained that, given her prolonged period w/ minimal intake, pt initially will likely only be able to tolerate several ounces at a time. Will start w/ bolus sizes of only 1 half can. Will start with 4 oz boluses for first 1-2 days (longer if experiences intolerance). Pt feels that even 4 oz seemed like a lot to her - she may start with 2 oz.   RD demonstrated how to use plunger for gravity feeds ans flushes. Allowed spouse to try as  well.   Reviewed following basic guidelines  Sit up during feeding, shoulders need to be atleast higher than stomach. Stay upright 30-60 min after feed.  Keep formula at room temperature-UNLESS opened-in which case can be stored in fridge for 24 hrs before needing to be discarded. Chilled feeds need to be allowed to come to room temp prior to administrating   Site care: Clean w/ soap and water only-no abrasive or alcohol based cleansers   Only formula, water, ensure/boost, appropriate medications (that cant be swallowed) and maybe rehydrating beverages (G2, Ceralyte, pedialyte) should go through tube- no pureed solids  RD reviewed s/s of intolerance: gastric distension, diarrhea, nausea or abdominal pain. Emphasized that while pts stools will be looser on tube feeds, they should NOT be diarrhea-like   RD encouraged continued oral intake as best able. MD stated he would order patch for nausea.   Answered other pt/spouse questions. RD contact information provided. [facility] representative meeting w/ pt. RD will place Electronic order for MD to cosign.  GOAL:  Oral intake to meet >90% of needs, weight stability  MONITOR:  Weight, oral intake, level of dysphagia/diet tolerance, treatment plan, PEG placement, TF tolerance  Next Visit:   Burtis Junes RD, LDN, CNSC Clinical Nutrition Available Tues-Sat via Pager: [phone] 09/29/2018 9:12 AM

## 2018-09-29 ENCOUNTER — Encounter (HOSPITAL_COMMUNITY): Payer: Self-pay | Admitting: Hematology

## 2018-09-29 ENCOUNTER — Inpatient Hospital Stay (HOSPITAL_BASED_OUTPATIENT_CLINIC_OR_DEPARTMENT_OTHER): Payer: Managed Care, Other (non HMO) | Admitting: Hematology

## 2018-09-29 ENCOUNTER — Inpatient Hospital Stay (HOSPITAL_COMMUNITY): Payer: Managed Care, Other (non HMO)

## 2018-09-29 ENCOUNTER — Other Ambulatory Visit (HOSPITAL_COMMUNITY): Payer: Self-pay | Admitting: Dietician

## 2018-09-29 ENCOUNTER — Other Ambulatory Visit: Payer: Self-pay

## 2018-09-29 VITALS — BP 92/64 | HR 116 | Temp 97.5°F | Resp 20 | Wt 76.0 lb

## 2018-09-29 DIAGNOSIS — R112 Nausea with vomiting, unspecified: Secondary | ICD-10-CM

## 2018-09-29 DIAGNOSIS — Z79899 Other long term (current) drug therapy: Secondary | ICD-10-CM

## 2018-09-29 DIAGNOSIS — C3490 Malignant neoplasm of unspecified part of unspecified bronchus or lung: Secondary | ICD-10-CM | POA: Diagnosis not present

## 2018-09-29 DIAGNOSIS — K5900 Constipation, unspecified: Secondary | ICD-10-CM

## 2018-09-29 DIAGNOSIS — R1310 Dysphagia, unspecified: Secondary | ICD-10-CM

## 2018-09-29 DIAGNOSIS — R634 Abnormal weight loss: Secondary | ICD-10-CM | POA: Diagnosis not present

## 2018-09-29 DIAGNOSIS — R531 Weakness: Secondary | ICD-10-CM

## 2018-09-29 DIAGNOSIS — Z791 Long term (current) use of non-steroidal anti-inflammatories (NSAID): Secondary | ICD-10-CM

## 2018-09-29 DIAGNOSIS — M25511 Pain in right shoulder: Secondary | ICD-10-CM

## 2018-09-29 LAB — COMPREHENSIVE METABOLIC PANEL
ALBUMIN: 3.8 g/dL (ref 3.5–5.0)
ALT: 10 U/L (ref 0–44)
AST: 14 U/L — AB (ref 15–41)
Alkaline Phosphatase: 81 U/L (ref 38–126)
Anion gap: 11 (ref 5–15)
BUN: 9 mg/dL (ref 6–20)
CO2: 30 mmol/L (ref 22–32)
Calcium: 9.3 mg/dL (ref 8.9–10.3)
Chloride: 92 mmol/L — ABNORMAL LOW (ref 98–111)
Creatinine, Ser: 0.43 mg/dL — ABNORMAL LOW (ref 0.44–1.00)
GFR calc Af Amer: >60 mL/min (ref >60)
GFR calc non Af Amer: >60 mL/min (ref >60)
GLUCOSE: 127 mg/dL — AB (ref 70–99)
Potassium: 3.8 mmol/L (ref 3.5–5.1)
Sodium: 133 mmol/L — ABNORMAL LOW (ref 135–145)
Total Bilirubin: 0.4 mg/dL (ref 0.3–1.2)
Total Protein: 7 g/dL (ref 6.5–8.1)

## 2018-09-29 LAB — CBC WITH DIFFERENTIAL/PLATELET
Abs Immature Granulocytes: 0.04 10*3/uL (ref 0.00–0.07)
BASOS ABS: 0 10*3/uL (ref 0.0–0.1)
Basophils Relative: 0 %
Eosinophils Absolute: 0.2 10*3/uL (ref 0.0–0.5)
Eosinophils Relative: 3 %
HEMATOCRIT: 41.2 % (ref 36.0–46.0)
Hemoglobin: 12.9 g/dL (ref 12.0–15.0)
Immature Granulocytes: 1 %
Lymphocytes Relative: 4 %
Lymphs Abs: 0.3 10*3/uL — ABNORMAL LOW (ref 0.7–4.0)
MCH: 27 pg (ref 26.0–34.0)
MCHC: 31.3 g/dL (ref 30.0–36.0)
MCV: 86.2 fL (ref 80.0–100.0)
Monocytes Absolute: 0.8 10*3/uL (ref 0.1–1.0)
Monocytes Relative: 9 %
Neutro Abs: 6.7 10*3/uL (ref 1.7–7.7)
Neutrophils Relative %: 83 %
Platelets: 301 10*3/uL (ref 150–400)
RBC: 4.78 MIL/uL (ref 3.87–5.11)
RDW: 13.3 % (ref 11.5–15.5)
WBC: 8.1 10*3/uL (ref 4.0–10.5)
nRBC: 0 % (ref 0.0–0.2)

## 2018-09-29 LAB — MAGNESIUM: Magnesium: 2.2 mg/dL (ref 1.7–2.4)

## 2018-09-29 LAB — LACTATE DEHYDROGENASE: LDH: 146 U/L (ref 98–192)

## 2018-09-29 MED ORDER — SODIUM CHLORIDE 0.9 % IV SOLN
8.0000 mg | Freq: Once | INTRAVENOUS | Status: AC
  Administered 2018-09-29: 8 mg via INTRAVENOUS
  Filled 2018-09-29: qty 4

## 2018-09-29 MED ORDER — SODIUM CHLORIDE 0.9 % IV SOLN
Freq: Once | INTRAVENOUS | Status: AC
  Administered 2018-09-29: 11:00:00 via INTRAVENOUS
  Filled 2018-09-29: qty 1000

## 2018-09-29 MED ORDER — SODIUM CHLORIDE 0.9 % IV SOLN
INTRAVENOUS | Status: DC
  Administered 2018-09-29: 10:00:00 via INTRAVENOUS

## 2018-09-29 MED ORDER — SCOPOLAMINE 1 MG/3DAYS TD PT72
1.0000 | MEDICATED_PATCH | TRANSDERMAL | Status: DC
  Administered 2018-09-29: 1.5 mg via TRANSDERMAL
  Filled 2018-09-29: qty 1

## 2018-09-29 NOTE — Progress Notes (Signed)
Patient for fluids and nausea medication today. Will start treatment next week per MD.  Vitals stable and discharged home from clinic via wheelchair.Follow up as scheduled.

## 2018-09-29 NOTE — Patient Instructions (Addendum)
[place] at [facility] Discharge Instructions  WE will start your treatment next week.  YOU will have labs prior to your treatment to evaluate your blood counts.   Thank you for choosing [place] at [facility] to provide your oncology and hematology care.  To afford each patient quality time with our provider, please arrive at least 15 minutes before your scheduled appointment time.   If you have a lab appointment with the [place] please come in thru the  Main Entrance and check in at the main information desk  You need to re-schedule your appointment should you arrive 10 or more minutes late.  We strive to give you quality time with our providers, and arriving late affects you and other patients whose appointments are after yours.  Also, if you no show three or more times for appointments you may be dismissed from the clinic at the providers discretion.     Again, thank you for choosing [facility].  Our hope is that these requests will decrease the amount of time that you wait before being seen by our physicians.       _____________________________________________________________  Should you have questions after your visit to [facility], please contact our office at [phone] between the hours of 8:00 a.m. and 4:30 p.m.  Voicemails left after 4:00 p.m. will not be returned until the following business day.  For prescription refill requests, have your pharmacy contact our office and allow 72 hours.    Cancer Center Support Programs:   > Cancer Support Group  2nd Tuesday of the month 1pm-2pm, Journey Room

## 2018-09-29 NOTE — Progress Notes (Signed)
[address], [place] [address]   CLINIC:  Medical Oncology/Hematology  PCP:  Sharion Balloon, [address] [place] [address] [phone]   REASON FOR VISIT: Follow-up for adenocarcinoma of the lung  CURRENT THERAPY:chemoradiation   BRIEF ONCOLOGIC HISTORY:    Adenocarcinoma of lung ([place])   09/07/2018 Initial Diagnosis    Adenocarcinoma of lung ([place])    09/29/2018 -  Chemotherapy    The patient had palonosetron (ALOXI) injection 0.25 mg, 0.25 mg, Intravenous,  Once, 0 of 4 cycles CARBOplatin (PARAPLATIN) in sodium chloride 0.9 % 100 mL chemo infusion, , Intravenous,  Once, 0 of 4 cycles PACLitaxel (TAXOL) 60 mg in sodium chloride 0.9 % 150 mL chemo infusion (</= 80mg /m2), 45 mg/m2, Intravenous,  Once, 0 of 4 cycles  for chemotherapy treatment.       INTERVAL HISTORY:  Ms. Alles 63 y.o. female returns for routine follow-up for adenocarcinoma of the lung. She is here with her husband today. She is still not eating and has lost 6 more pounds. She will go for a PEG tube on Friday. She is unable to eat due to nausea. She is not having bowel movement due to lack of food. Denies any nausea, vomiting, or diarrhea. Denies any new pains. Had not noticed any recent bleeding such as epistaxis, hematuria or hematochezia. Denies recent chest pain on exertion, shortness of breath on minimal exertion, pre-syncopal episodes, or palpitations. Denies any numbness or tingling in hands or feet. Denies any recent fevers, infections, or recent hospitalizations. Patient reports appetite at 0% and energy level at 0%. She does not want to start treatment today because she is not mentally prepared so she wants to postpone it another week.    REVIEW OF SYSTEMS:  Review of Systems  Constitutional: Positive for fatigue.  Respiratory: Positive for shortness of breath.   Gastrointestinal: Positive for constipation, nausea and vomiting.  Neurological:  Positive for extremity weakness and numbness.  All other systems reviewed and are negative.    PAST MEDICAL/SURGICAL HISTORY:  Past Medical History:  Diagnosis Date  . GAD (generalized anxiety disorder) 01/21/2017  . Lymphadenopathy   . Underweight 01/21/2017   Past Surgical History:  Procedure Laterality Date  . ABDOMINAL HYSTERECTOMY    . CESAREAN SECTION     x 2  . LYMPH NODE BIOPSY Left 08/30/2018   Procedure: CERVICAL LYMPH NODE BIOPSY;  Surgeon: Aviva Signs, MD;  Location: [facility];  Service: General;  Laterality: Left;  . PORTACATH PLACEMENT Right 09/17/2018   Procedure: INSERTION PORT-A-CATH (ATTACHED CATHETER IN RIGHT SUBCLAVIAN);  Surgeon: Aviva Signs, MD;  Location: [facility];  Service: General;  Laterality: Right;     SOCIAL HISTORY:  Social History   Socioeconomic History  . Marital status: Married    Spouse name: Not on file  . Number of children: 2  . Years of education: Not on file  . Highest education level: Not on file  Occupational History  . Occupation: housekeeper  Social Needs  . Financial resource strain: Not hard at all  . Food insecurity:    Worry: Never true    Inability: Never true  . Transportation needs:    Medical: No    Non-medical: No  Tobacco Use  . Smoking status: Current Every Day Smoker    Packs/day: 1.00    Years: 40.00    Pack years: 40.00    Types: Cigarettes    Start date: 11/02/1978  . Smokeless  tobacco: Never Used  Substance and Sexual Activity  . Alcohol use: No  . Drug use: No  . Sexual activity: Not on file  Lifestyle  . Physical activity:    Days per week: 0 days    Minutes per session: 0 min  . Stress: To some extent  Relationships  . Social connections:    Talks on phone: Twice a week    Gets together: Never    Attends religious service: Never    Active member of club or organization: No    Attends meetings of clubs or organizations: Never    Relationship status: Married  . Intimate partner violence:     Fear of current or ex partner: No    Emotionally abused: No    Physically abused: No    Forced sexual activity: No  Other Topics Concern  . Not on file  Social History Narrative   Housekeeping at [employer].    FAMILY HISTORY:  Family History  Problem Relation Age of Onset  . Diabetes Mother   . Heart disease Mother        Pacemaker   . Cancer Father        Lung  . Cancer Brother        bladder then colon  . Down syndrome Brother   . Mental illness Daughter     CURRENT MEDICATIONS:  Outpatient Encounter Medications as of 09/29/2018  Medication Sig  . diphenhydramine-acetaminophen (TYLENOL PM) 25-500 MG TABS tablet Take 1 tablet by mouth at bedtime.   Marland Kitchen escitalopram (LEXAPRO) 10 MG tablet Take 1 tablet (10 mg total) by mouth daily.  . fluconazole (DIFLUCAN) 100 MG tablet   . HYDROcodone-acetaminophen (NORCO) 5-325 MG tablet Take 1 tablet by mouth every 6 (six) hours as needed for moderate pain.  Marland Kitchen ibuprofen (ADVIL,MOTRIN) 200 MG tablet Take 200-400 mg by mouth 2 (two) times daily as needed for moderate pain.  . Ipratropium-Albuterol (COMBIVENT) 20-100 MCG/ACT AERS respimat Inhale 1 puff into the lungs every 6 (six) hours.  . lidocaine-prilocaine (EMLA) cream Apply to affected area once  . Naphazoline-Glycerin (REDNESS RELIEF OP) Place 1 drop into both eyes daily.  . prochlorperazine (COMPAZINE) 10 MG tablet Take 1 tablet (10 mg total) by mouth every 6 (six) hours as needed for nausea or vomiting.  . prochlorperazine (COMPAZINE) 10 MG tablet Take 1 tablet (10 mg total) by mouth every 6 (six) hours as needed (Nausea or vomiting).  . Spacer/Aero-Holding Chambers (BREATHERITE COLL SPACER ADULT) MISC 1 Device by Does not apply route every 6 (six) hours.  . CARBOPLATIN IV Inject into the vein once a week.  Marland Kitchen PACLitaxel (TAXOL IV) Inject into the vein once a week.   No facility-administered encounter medications on file as of 09/29/2018.     ALLERGIES:  Allergies  Allergen  Reactions  . Biaxin [Clarithromycin] Nausea And Vomiting     PHYSICAL EXAM:  ECOG Performance status: 1  Vitals:   09/29/18 0900  BP: 92/64  Pulse: (!) 116  Resp: 20  Temp: (!) 97.5 F (36.4 C)  SpO2: 98%   Filed Weights   09/29/18 0900  Weight: 76 lb (34.5 kg)    Physical Exam Constitutional:      Appearance: Normal appearance. She is normal weight.  Musculoskeletal: Normal range of motion.  Skin:    General: Skin is warm and dry.  Neurological:     Mental Status: She is alert and oriented to person, place, and time. Mental status is at  baseline.  Psychiatric:        Mood and Affect: Mood normal.        Behavior: Behavior normal.        Thought Content: Thought content normal.        Judgment: Judgment normal.      LABORATORY DATA:  I have reviewed the labs as listed.  CBC    Component Value Date/Time   WBC 8.1 09/29/2018 0838   RBC 4.78 09/29/2018 0838   HGB 12.9 09/29/2018 0838   HGB 12.5 07/02/2018 1847   HCT 41.2 09/29/2018 0838   HCT 37.3 07/02/2018 1847   PLT 301 09/29/2018 0838   PLT 407 07/02/2018 1847   MCV 86.2 09/29/2018 0838   MCV 86 07/02/2018 1847   MCH 27.0 09/29/2018 0838   MCHC 31.3 09/29/2018 0838   RDW 13.3 09/29/2018 0838   RDW 12.4 07/02/2018 1847   LYMPHSABS 0.3 (L) 09/29/2018 0838   LYMPHSABS 2.1 07/02/2018 1847   MONOABS 0.8 09/29/2018 0838   EOSABS 0.2 09/29/2018 0838   EOSABS 0.1 07/02/2018 1847   BASOSABS 0.0 09/29/2018 0838   BASOSABS 0.1 07/02/2018 1847   CMP Latest Ref Rng & Units 09/29/2018 09/07/2018 08/17/2018  Glucose 70 - 99 mg/dL 127(H) 122(H) 110(H)  BUN 6 - 20 mg/dL 9 10 5(L)  Creatinine 0.44 - 1.00 mg/dL 0.43(L) 0.47 0.54  Sodium 135 - 145 mmol/L 133(L) 136 139  Potassium 3.5 - 5.1 mmol/L 3.8 3.9 4.0  Chloride 98 - 111 mmol/L 92(L) 96(L) 104  CO2 22 - 32 mmol/L 30 30 25   Calcium 8.9 - 10.3 mg/dL 9.3 9.8 9.3  Total Protein 6.5 - 8.1 g/dL 7.0 7.5 7.7  Total Bilirubin 0.3 - 1.2 mg/dL 0.4 0.4 0.6  Alkaline  Phos 38 - 126 U/L 81 79 89  AST 15 - 41 U/L 14(L) 15 14(L)  ALT 0 - 44 U/L 10 10 8        DIAGNOSTIC IMAGING:  I have independently reviewed the scans and discussed with the patient.   I have reviewed Francene Finders, NP's note and agree with the documentation.  I personally performed a face-to-face visit, made revisions and my assessment and plan is as follows.    ASSESSMENT & PLAN:   Adenocarcinoma of lung ([place]) 1.  Non-small cell lung cancer (Tx N3 M0), adenocarcinoma: - Patient noticed lymphadenopathy in the neck region in October 2019.  She also has night sweats but denies any fevers or significant weight loss. - Difficulty swallowing for the past few weeks.  Drinking Ensure 1 to 2 cans/day. - CT of the chest without contrast on 08/12/2018 shows bulky right hilar and mediastinal adenopathy with bilateral supraclavicular adenopathy, scattered small lung lesions nonspecific.  1.2 cm liver lesion incompletely visualized, may represent cyst, hemangioma. -Physical exam reveals bilateral supraclavicular adenopathy. - She had a biopsy of the left supra clavicular lymph node on 08/30/2018 which showed metastatic poorly differentiated adenocarcinoma consistent with lung primary.  Tumor was strongly positive for CK7 and TTF-1.  Weak positivity for Napsin-A and MOC-31. - She is not eating much.  She lost about 5 pounds in the last 2 weeks.  She eats mashed potatoes and cooked squash.  She is drinking 7-Up and cranberry juice.  She is not able to tolerate Ensure or boost because of the sweetness. - PET/CT scan on 08/23/2018 shows lymphadenopathy in the bilateral supraclavicular regions, mediastinum, subcarinal, right hilar more than left hilar with no pulmonary pathology.  No subdiaphragmatic disease seen.   -  MRI of the brain without contrast on 08/24/2018 shows several small areas of nonspecific T2/flair hyperintensity in the cerebral white matter, some with mild T2 shine through on diffusion. -CT  scan of the brain with and without contrast on 09/17/2018 shows no intracranial metastasis. -She was started on palliative radiation on 09/14/2018.  She cannot lay flat on the table.  She is being treated in the left lateral position.  She reported her cough has slightly improved after radiation started. - She finished palliative radiation on 09/28/2018. - She is always nervous and was surprised to learn that she will start her chemotherapy today.  She wants to wait until next week.  We will schedule her for next week. -We talked about the chemotherapy regimen containing carboplatin and paclitaxel given weekly.  We discussed side effects in detail. -For her intermittent nausea, we will start her on scopolamine patch.  She will receive some fluids and IV Zofran today.  She is not able to swallow any pills. -She is not taking in any calories.  She drinks 7-Up.  She tried all kinds of Ensure but could not tolerate it.  We have arranged for her to have a PEG tube placed.  This will happen on 10/01/2018. -She will come back in 1 week to initiate her therapy.  2.  Right shoulder pain: -She is taking hydrocodone 5 mg twice daily as needed for pain in the right scapula, radiating to the axilla and upper arm.    3.  COPD: -She will use Combivent inhaler on a regular basis.      Orders placed this encounter:  Orders Placed This Encounter  Procedures  . Magnesium  . CBC with Differential/Platelet  . Comprehensive metabolic panel  . Phosphorus  . Lactate dehydrogenase      Derek Jack, MD [place] [phone]

## 2018-09-29 NOTE — Assessment & Plan Note (Signed)
1.  Non-small cell lung cancer (Tx N3 M0), adenocarcinoma: - Patient noticed lymphadenopathy in the neck region in October 2019.  She also has night sweats but denies any fevers or significant weight loss. - Difficulty swallowing for the past few weeks.  Drinking Ensure 1 to 2 cans/day. - CT of the chest without contrast on 08/12/2018 shows bulky right hilar and mediastinal adenopathy with bilateral supraclavicular adenopathy, scattered small lung lesions nonspecific.  1.2 cm liver lesion incompletely visualized, may represent cyst, hemangioma. -Physical exam reveals bilateral supraclavicular adenopathy. - She had a biopsy of the left supra clavicular lymph node on 08/30/2018 which showed metastatic poorly differentiated adenocarcinoma consistent with lung primary.  Tumor was strongly positive for CK7 and TTF-1.  Weak positivity for Napsin-A and MOC-31. - She is not eating much.  She lost about 5 pounds in the last 2 weeks.  She eats mashed potatoes and cooked squash.  She is drinking 7-Up and cranberry juice.  She is not able to tolerate Ensure or boost because of the sweetness. - PET/CT scan on 08/23/2018 shows lymphadenopathy in the bilateral supraclavicular regions, mediastinum, subcarinal, right hilar more than left hilar with no pulmonary pathology.  No subdiaphragmatic disease seen.   -MRI of the brain without contrast on 08/24/2018 shows several small areas of nonspecific T2/flair hyperintensity in the cerebral white matter, some with mild T2 shine through on diffusion. -CT scan of the brain with and without contrast on 09/17/2018 shows no intracranial metastasis. -She was started on palliative radiation on 09/14/2018.  She cannot lay flat on the table.  She is being treated in the left lateral position.  She reported her cough has slightly improved after radiation started. - She finished palliative radiation on 09/28/2018. - She is always nervous and was surprised to learn that she will start her  chemotherapy today.  She wants to wait until next week.  We will schedule her for next week. -We talked about the chemotherapy regimen containing carboplatin and paclitaxel given weekly.  We discussed side effects in detail. -For her intermittent nausea, we will start her on scopolamine patch.  She will receive some fluids and IV Zofran today.  She is not able to swallow any pills. -She is not taking in any calories.  She drinks 7-Up.  She tried all kinds of Ensure but could not tolerate it.  We have arranged for her to have a PEG tube placed.  This will happen on 10/01/2018. -She will come back in 1 week to initiate her therapy.  2.  Right shoulder pain: -She is taking hydrocodone 5 mg twice daily as needed for pain in the right scapula, radiating to the axilla and upper arm.    3.  COPD: -She will use Combivent inhaler on a regular basis.

## 2018-09-29 NOTE — Patient Instructions (Signed)
[facility] at [facility]  Discharge Instructions:   _______________________________________________________________  Thank you for choosing [facility] at [facility] to provide your oncology and hematology care.  To afford each patient quality time with our providers, please arrive at least 15 minutes before your scheduled appointment.  You need to re-schedule your appointment if you arrive 10 or more minutes late.  We strive to give you quality time with our providers, and arriving late affects you and other patients whose appointments are after yours.  Also, if you no show three or more times for appointments you may be dismissed from the clinic.  Again, thank you for choosing [name] at [facility]. Our hope is that these requests will allow you access to exceptional care and in a timely manner. _______________________________________________________________  If you have questions after your visit, please contact our office at [phone] between the hours of 8:30 a.m. and 5:00 p.m. Voicemails left after 4:30 p.m. will not be returned until the following business day. _______________________________________________________________  For prescription refill requests, have your pharmacy contact our office. _______________________________________________________________  Recommendations made by the consultant and any test results will be sent to your referring physician. _______________________________________________________________ 

## 2018-10-01 ENCOUNTER — Encounter (HOSPITAL_COMMUNITY): Payer: Self-pay | Admitting: *Deleted

## 2018-10-01 ENCOUNTER — Other Ambulatory Visit (HOSPITAL_COMMUNITY): Payer: Self-pay | Admitting: *Deleted

## 2018-10-01 ENCOUNTER — Ambulatory Visit (HOSPITAL_COMMUNITY): Payer: Managed Care, Other (non HMO) | Admitting: Anesthesiology

## 2018-10-01 ENCOUNTER — Other Ambulatory Visit: Payer: Self-pay

## 2018-10-01 ENCOUNTER — Encounter (HOSPITAL_COMMUNITY)
Admission: RE | Disposition: A | Discharge status: Home / Self Care | Payer: Self-pay | Source: Home / Self Care | Attending: General Surgery

## 2018-10-01 ENCOUNTER — Ambulatory Visit (HOSPITAL_COMMUNITY)
Admission: RE | Admit: 2018-10-01 | Discharge: 2018-10-01 | Disposition: A | Discharge status: Home / Self Care | Payer: Managed Care, Other (non HMO) | Attending: General Surgery | Admitting: General Surgery

## 2018-10-01 DIAGNOSIS — Z818 Family history of other mental and behavioral disorders: Secondary | ICD-10-CM | POA: Diagnosis not present

## 2018-10-01 DIAGNOSIS — Z9221 Personal history of antineoplastic chemotherapy: Secondary | ICD-10-CM | POA: Insufficient documentation

## 2018-10-01 DIAGNOSIS — F17210 Nicotine dependence, cigarettes, uncomplicated: Secondary | ICD-10-CM | POA: Insufficient documentation

## 2018-10-01 DIAGNOSIS — Z8249 Family history of ischemic heart disease and other diseases of the circulatory system: Secondary | ICD-10-CM | POA: Diagnosis not present

## 2018-10-01 DIAGNOSIS — Z791 Long term (current) use of non-steroidal anti-inflammatories (NSAID): Secondary | ICD-10-CM | POA: Insufficient documentation

## 2018-10-01 DIAGNOSIS — F411 Generalized anxiety disorder: Secondary | ICD-10-CM | POA: Insufficient documentation

## 2018-10-01 DIAGNOSIS — Z923 Personal history of irradiation: Secondary | ICD-10-CM | POA: Insufficient documentation

## 2018-10-01 DIAGNOSIS — Z833 Family history of diabetes mellitus: Secondary | ICD-10-CM | POA: Insufficient documentation

## 2018-10-01 DIAGNOSIS — Z810 Family history of intellectual disabilities: Secondary | ICD-10-CM | POA: Insufficient documentation

## 2018-10-01 DIAGNOSIS — E46 Unspecified protein-calorie malnutrition: Secondary | ICD-10-CM | POA: Diagnosis not present

## 2018-10-01 DIAGNOSIS — R4702 Dysphasia: Secondary | ICD-10-CM | POA: Diagnosis not present

## 2018-10-01 DIAGNOSIS — Z800 Family history of malignant neoplasm of digestive organs: Secondary | ICD-10-CM | POA: Diagnosis not present

## 2018-10-01 DIAGNOSIS — Z881 Allergy status to other antibiotic agents status: Secondary | ICD-10-CM | POA: Diagnosis not present

## 2018-10-01 DIAGNOSIS — Z681 Body mass index (BMI) 19 or less, adult: Secondary | ICD-10-CM | POA: Insufficient documentation

## 2018-10-01 DIAGNOSIS — Z79899 Other long term (current) drug therapy: Secondary | ICD-10-CM | POA: Insufficient documentation

## 2018-10-01 DIAGNOSIS — C7800 Secondary malignant neoplasm of unspecified lung: Secondary | ICD-10-CM | POA: Insufficient documentation

## 2018-10-01 DIAGNOSIS — Z801 Family history of malignant neoplasm of trachea, bronchus and lung: Secondary | ICD-10-CM | POA: Insufficient documentation

## 2018-10-01 DIAGNOSIS — C3490 Malignant neoplasm of unspecified part of unspecified bronchus or lung: Secondary | ICD-10-CM | POA: Diagnosis not present

## 2018-10-01 DIAGNOSIS — C801 Malignant (primary) neoplasm, unspecified: Secondary | ICD-10-CM | POA: Insufficient documentation

## 2018-10-01 DIAGNOSIS — R1310 Dysphagia, unspecified: Secondary | ICD-10-CM

## 2018-10-01 DIAGNOSIS — Z90710 Acquired absence of both cervix and uterus: Secondary | ICD-10-CM | POA: Diagnosis not present

## 2018-10-01 DIAGNOSIS — Z8052 Family history of malignant neoplasm of bladder: Secondary | ICD-10-CM | POA: Diagnosis not present

## 2018-10-01 HISTORY — PX: ESOPHAGOGASTRODUODENOSCOPY (EGD) WITH PROPOFOL: SHX5813

## 2018-10-01 HISTORY — PX: PEG PLACEMENT: SHX5437

## 2018-10-01 SURGERY — ESOPHAGOGASTRODUODENOSCOPY (EGD) WITH PROPOFOL
Anesthesia: General | Site: Esophagus

## 2018-10-01 MED ORDER — PROPOFOL 10 MG/ML IV BOLUS
INTRAVENOUS | Status: AC
  Filled 2018-10-01: qty 20

## 2018-10-01 MED ORDER — LACTATED RINGERS IV SOLN
INTRAVENOUS | Status: DC
  Administered 2018-10-01: 10:00:00 via INTRAVENOUS

## 2018-10-01 MED ORDER — SUCCINYLCHOLINE CHLORIDE 20 MG/ML IJ SOLN
INTRAMUSCULAR | Status: DC | PRN
  Administered 2018-10-01: 80 mg via INTRAVENOUS

## 2018-10-01 MED ORDER — HYDROMORPHONE HCL 1 MG/ML IJ SOLN
0.2500 mg | INTRAMUSCULAR | Status: DC | PRN
  Administered 2018-10-01 (×2): 0.5 mg via INTRAVENOUS

## 2018-10-01 MED ORDER — HYDROMORPHONE HCL 1 MG/ML IJ SOLN
INTRAMUSCULAR | Status: AC
  Filled 2018-10-01: qty 0.5

## 2018-10-01 MED ORDER — FENTANYL CITRATE (PF) 100 MCG/2ML IJ SOLN
INTRAMUSCULAR | Status: AC
  Filled 2018-10-01: qty 2

## 2018-10-01 MED ORDER — HEPARIN SOD (PORK) LOCK FLUSH 100 UNIT/ML IV SOLN
500.0000 [IU] | Freq: Once | INTRAVENOUS | Status: AC
  Administered 2018-10-01: 500 [IU] via INTRAVENOUS

## 2018-10-01 MED ORDER — KETAMINE HCL 50 MG/5ML IJ SOSY
PREFILLED_SYRINGE | INTRAMUSCULAR | Status: AC
  Filled 2018-10-01: qty 5

## 2018-10-01 MED ORDER — MEPERIDINE HCL 50 MG/ML IJ SOLN: 6.2500 mg | INTRAMUSCULAR | Status: DC | PRN

## 2018-10-01 MED ORDER — BUTAMBEN-TETRACAINE-BENZOCAINE 2-2-14 % EX AERO
INHALATION_SPRAY | CUTANEOUS | Status: DC | PRN
  Administered 2018-10-01: 1 via TOPICAL

## 2018-10-01 MED ORDER — CHLORHEXIDINE GLUCONATE CLOTH 2 % EX PADS: 6.0000 | MEDICATED_PAD | Freq: Once | CUTANEOUS | Status: DC

## 2018-10-01 MED ORDER — PROPOFOL 500 MG/50ML IV EMUL
INTRAVENOUS | Status: DC | PRN
  Administered 2018-10-01: 100 ug/kg/min via INTRAVENOUS

## 2018-10-01 MED ORDER — LIDOCAINE HCL (PF) 1 % IJ SOLN
INTRAMUSCULAR | Status: DC | PRN
  Administered 2018-10-01: 3 mL

## 2018-10-01 MED ORDER — LACTATED RINGERS IV SOLN: INTRAVENOUS | Status: DC

## 2018-10-01 MED ORDER — HYDROCODONE-ACETAMINOPHEN 7.5-325 MG PO TABS: 1.0000 | ORAL_TABLET | Freq: Once | ORAL | Status: DC | PRN

## 2018-10-01 MED ORDER — BUTAMBEN-TETRACAINE-BENZOCAINE 2-2-14 % EX AERO
INHALATION_SPRAY | CUTANEOUS | Status: AC
  Filled 2018-10-01: qty 5

## 2018-10-01 MED ORDER — CIPROFLOXACIN IN D5W 400 MG/200ML IV SOLN
INTRAVENOUS | Status: AC
  Filled 2018-10-01: qty 200

## 2018-10-01 MED ORDER — LABETALOL HCL 5 MG/ML IV SOLN
INTRAVENOUS | Status: AC
  Filled 2018-10-01: qty 4

## 2018-10-01 MED ORDER — CIPROFLOXACIN IN D5W 400 MG/200ML IV SOLN
400.0000 mg | INTRAVENOUS | Status: AC
  Administered 2018-10-01: 400 mg via INTRAVENOUS

## 2018-10-01 MED ORDER — KETOROLAC TROMETHAMINE 30 MG/ML IJ SOLN
15.0000 mg | Freq: Once | INTRAMUSCULAR | Status: AC
  Administered 2018-10-01: 15 mg via INTRAVENOUS
  Filled 2018-10-01: qty 1

## 2018-10-01 MED ORDER — PROMETHAZINE HCL 25 MG/ML IJ SOLN: 6.2500 mg | INTRAMUSCULAR | Status: DC | PRN

## 2018-10-01 MED ORDER — ETOMIDATE 2 MG/ML IV SOLN
INTRAVENOUS | Status: DC | PRN
  Administered 2018-10-01: 8 mg via INTRAVENOUS

## 2018-10-01 MED ORDER — HEPARIN SOD (PORK) LOCK FLUSH 100 UNIT/ML IV SOLN
INTRAVENOUS | Status: AC
  Filled 2018-10-01: qty 5

## 2018-10-01 MED ORDER — FENTANYL CITRATE (PF) 100 MCG/2ML IJ SOLN
INTRAMUSCULAR | Status: DC | PRN
  Administered 2018-10-01: 25 ug via INTRAVENOUS

## 2018-10-01 SURGICAL SUPPLY — 3 items
KIT PEG SAFETY 20FR (KITS) ×2 IMPLANT
SPONGE DRAIN TRACH 4X4 STRL 2S (GAUZE/BANDAGES/DRESSINGS) ×2 IMPLANT
TAPE PAPER 2X10 WHT MICROPORE (GAUZE/BANDAGES/DRESSINGS) ×2 IMPLANT

## 2018-10-01 NOTE — Addendum Note (Signed)
Addendum  created 10/01/18 1340 by Mickel Baas, CRNA   Intraprocedure Flowsheets edited

## 2018-10-01 NOTE — Anesthesia Preprocedure Evaluation (Signed)
Anesthesia Evaluation    Airway Mallampati: II       Dental  (+) Teeth Intact, Poor Dentition, Dental Advidsory Given   Pulmonary Current Smoker,     + decreased breath sounds+ wheezing      Cardiovascular  Rhythm:regular     Neuro/Psych    GI/Hepatic   Endo/Other    Renal/GU      Musculoskeletal   Abdominal   Peds  Hematology   Anesthesia Other Findings On going tobacco abuse in setting of AdenoCA lung, S/P chemo/rad  Chronic, productive cough  Reproductive/Obstetrics                             Anesthesia Physical Anesthesia Plan  ASA: IV  Anesthesia Plan: General   Post-op Pain Management:    Induction:   PONV Risk Score and Plan:   Airway Management Planned:   Additional Equipment:   Intra-op Plan:   Post-operative Plan:   Informed Consent: I have reviewed the patients History and Physical, chart, labs and discussed the procedure including the risks, benefits and alternatives for the proposed anesthesia with the patient or authorized representative who has indicated his/her understanding and acceptance.       Plan Discussed with: Anesthesiologist  Anesthesia Plan Comments:         Anesthesia Quick Evaluation

## 2018-10-01 NOTE — Discharge Instructions (Signed)
How to Care for a Feeding Tube  A feeding tube is a soft, flexible tube through which medicine, water, and liquid food can be given. A person may have a feeding tube if she or he has trouble swallowing or cannot have food or medicine by mouth. Supplies needed to care for the tube site:  Clean gloves.  Clean washcloth, gauze pads, or soft paper towel.  Cotton swabs.  Skin barrier ointment or cream, such as petroleum jelly.  Soap and water.  Pre-cut foam pads or gauze for around the tube.  Tube tape.  Anchoring device (optional). How to care for the tube site  1. Have all supplies ready and available. 2. Wash your hands well. 3. Put on clean gloves. 4. If there is a foam pad or gauze under the tube stabilizing disc and it is soiled or moist or has been there for more than one day, replace it. 5. Check the skin around the tube site for redness, a rash, swelling, drainage, or extra tissue growth. If you notice any of these, call your health care provider. 6. Use water and soap to moisten gauze pads and cotton swabs. 7. Use the moistened cotton swabs to wipe the area closest to the tube, right near the opening in the abdomen (stoma). 8. Use the moistened gauze pads to wipe the surrounding skin. 9. Rinse with water. 10. Use a washcloth, dry gauze pad, or soft paper towel to dry the skin and stoma site. 11. If the skin is red, use a cotton swab to apply a skin barrier cream or ointment in a circular motion. The cream or ointment will help the wound to heal. Do not apply antibiotic ointments at the tube site. 12. Apply a new pre-cut foam pad or gauze around the tube. If there is no drainage, you can leave off the foam pads or gauze. 13. Secure the pre-cut foam pad or gauze with tape around the edges. 14. Use tape or an anchoring device to fasten the feeding tube to the skin for comfort or as directed. Alternate where you put the tape to avoid damaging the skin. 15. Position the person in  a semi-upright position, at about a 30-45 degree angle. 16. Throw away used supplies. 17. Remove your gloves. 18. Wash your hands. Supplies needed to flush a feeding tube:  Clean gloves.  A clean 60 mL syringe that connects to the feeding tube.  Towel.  Sterile or purified water. Follow these guidelines: ? Use sterile water if:  You have a weak immune system and have difficulty fighting off infections (are immunocompromised).  You are unsure about the amount of chemical contaminants in purified or drinking water. ? Do not use fresh water found in lakes, rivers, reservoirs, or aquifers without treating or filtering first. ? To purify drinking water by boiling:  Boil water for at least 1 minute. Keep a lid over the water while it boils.  Allow the water to cool to room temperature before using. How to flush a feeding tube 1. Have all supplies ready and available. 2. Wash your hands well. 3. Put on clean gloves. 4. Draw up 30 mL of water into the syringe. 5. Before flushing, place the towel under the tube to catch any fluid leaks. 6. Kink the feeding tube while disconnecting it from the feeding-bag tubing or while removing the cap at the end of the tube. Kinking closes the tube and prevents fluid in the tube from spilling out. 7. Insert the tip  of the syringe into the end of the feeding tube. 8. Release the kink. 9. Slowly inject the water. If you are unable to inject the water, the tip of the tube may be against the person's stomach, blocking fluid flow. To fix this problem, have the person with the feeding tube lie on his or her left side. Then, try injecting the water again. If there is resistance, do not use a lot of force to overcome it because this could cause the tube to tear. 10. Remove the syringe and replace the cap. 11. Throw away used supplies. 12. Remove your gloves. 13. Wash your hands. Follow these instructions at home: Caring for the tube  If there is a foam pad  or gauze under the tube stabilizing disc, change it every day and when it is soiled or moist.  Do not apply antibiotic ointments at the tube site. Flushing the tube  Do not use a syringe that is smaller than 60 mL.  To prevent medicine from clogging the tube, flush the tube at all of these times: ? Before the person is given the first medicine. ? Between medicines. ? After the person is given the final medicine before starting a feeding.  Do not mix medicines with formula or with other medicines.  Thoroughly flush medicines through the tube so they do not mix with formula. Contact a health care provider if:  The tube becomes blocked or clogged.  You find any of these on the skin around the tube site: ? Redness. ? A rash. ? Swelling. ? Drainage. ? Extra tissue growth. Summary  A feeding tube is a soft, flexible tube through which medicine, water, and liquid food can be given. A person may have a feeding tube if she or he has trouble swallowing or cannot have food or medicine by mouth.  Follow instructions from your health care provider about daily care and flushing of the tube.  Contact your health care provider if the tube becomes blocked or clogged, or if you notice swelling, drainage, or changes in skin around the tube site. This information is not intended to replace advice given to you by your health care provider. Make sure you discuss any questions you have with your health care provider. Document Released: 08/18/2005 Document Revised: 09/20/2016 Document Reviewed: 09/20/2016 Elsevier Interactive Patient Education  2019 [employer]. Gastrostomy Tube Home Guide, Adult A gastrostomy tube, or G-tube, is a tube that is inserted through the abdomen into the stomach. The tube is used to give feedings and medicines when a person is unable to eat and drink enough on his or her own. How to care for a G-tube Supplies needed  Saline solution or clean, warm water and  soap.  Cotton swab or gauze.  Precut gauze bandage (dressing) and tape, if needed. Instructions 1. Wash your hands with soap and water. 2. If there is a dressing between the person's skin and the tube, remove it. 3. Check the area where the tube enters the skin. Check for problems such as: ? Redness. ? Swelling. ? Pus-like drainage. ? Extra skin growth. 4. Moisten the cotton swab with the saline solution or soap and water mixture. Gently clean around the insertion site. Remove any drainage or crusting. ? When the G-tube is first put in, a normal saline solution or water can be used to clean the skin. ? Mild soap and warm water can be used when the skin around the G-tube site has healed. 5. If there should  be a dressing between the person's skin and the tube, apply it at this time. How to flush a G-tube Flush the G-tube regularly to keep it from clogging. Flush it before and after feedings and as often as told by the health care provider. Supplies needed  Purified or sterile water, warmed. If the person has a weak disease-fighting (immune) system, or if he or she has difficulty fighting off infections (is immunocompromised), use only sterile water. ? If you are unsure about the amount of chemical contaminants in purified or drinking water, use sterile water. ? To purify drinking water by boiling:  Boil water for at least 1 minute. Keep lid over water while it boils. Allow water to cool to room temperature before using.  60cc G-tube syringe. Instructions 14. Wash your hands with soap and water. 15. Draw up 30 mL of warm water in a syringe. 16. Connect the syringe to the tube. 17. Slowly and gently push the water into the tube. G-tube problems and solutions  If the tube comes out: ? Cover the opening with a clean dressing and tape. ? Call a health care provider right away. ? A health care provider will need to put the tube back in within 4 hours.  If there is skin or scar tissue  growing where the tube enters the skin: ? Keep the area clean and dry. ? Secure the tube with tape so that the tube does not move around too much. ? Call a health care provider.  If the tube gets clogged: ? Slowly push warm water into the tube with a large syringe. ? Do not force the fluid into the tube or push an object into the tube. ? If you are not able to unclog the tube, call a health care provider right away. Follow these instructions at home: Feedings  Give feedings at room temperature.  Cover and place unused feedings in the refrigerator.  If feedings are continuous: ? Do not put more than 4 hours worth of feedings in the feeding bag. ? Stop the feedings when you need to give medicine or flush the tube. Be sure to restart the feedings. ? Make sure the person's head is above his or her stomach (upright position). This will prevent choking and discomfort.  Replace feeding bags and syringes as told by the health care provider.  Make sure the person is in the right position during and after feedings: ? During feedings, the person's position should be in the upright position. ? After a noncontinuous feeding (bolus feeding), have the person stay in the upright position for 1 hour. General instructions  Only use syringes made for G-tubes.  Do not pull or put tension on the tube.  Clamp the tube before removing the cap or disconnecting a syringe.  Measure the length of the G-tube every day from the insertion site to the end of the tube.  If the person's G-tube has a balloon, check the fluid in the balloon every week. The amount of fluid that should be in the balloon can be found in the manufacturers specifications.  Make sure the person takes care of his or her oral health, such as by brushing his or her teeth.  Remove excess air from the G-tube as told by the person's health care provider. This is called "venting."  Keep the area where the tube enters the skin clean and  dry.  Do not push feedings, medicines, or flushes rapidly. Contact a health care provider if:  The person with the tube has any of these problems: ? Constipation. ? Fever.  There is a large amount of fluid or mucus-like liquid leaking from the tube.  Skin or scar tissue appears to be growing where the tube enters the skin.  The length of tube from the insertion site to the G-tube gets longer. Get help right away if:  The person with the tube has any of these problems: ? Severe abdominal pain. ? Severe tenderness. ? Severe bloating. ? Nausea. ? Vomiting. ? Trouble breathing. ? Shortness of breath.  Any of these problems happen in the area where the tube enters the skin: ? Redness, irritation, swelling, or soreness. ? Pus-like discharge. ? A bad smell.  The tube is clogged and cannot be flushed.  The tube comes out. Summary  A gastrostomy tube, or G-tube, is a tube that is inserted through the abdomen into the stomach. The tube is used to give feedings and medicines when a person is unable to eat and drink enough on his or her own.  Check and clean the insertion site daily as told by the person's health care provider.  Flush the G-tube regularly to keep it from clogging. Flush it before and after feedings and as often as told by the person's health care provider.  Keep the area where the tube enters the skin clean and dry. This information is not intended to replace advice given to you by your health care provider. Make sure you discuss any questions you have with your health care provider. Document Released: 10/27/2001 Document Revised: 03/03/2017 Document Reviewed: 10/13/2016 Elsevier Interactive Patient Education  2019 [employer].

## 2018-10-01 NOTE — Op Note (Signed)
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  The risks and benefits of the procedure including bleeding, infection, and organ perforation were fully explained to the patient, gave informed consent.  Procedure note: Initially, we really tried to proceed with the back, but the patient did not tolerate this and thus anesthesia provided general endotracheal anesthesia.  Cetacaine spray was used on the oropharynx.  The endoscope was advanced down to the second portion of the duodenum without difficulty.  No ulcerations were seen.  The pylorus was noted to be widely patent.  The stomach was easily distensible.  Using transillumination and palpation, point in the antrum was identified.  1% Xylocaine was instilled into the left upper quadrant of the abdomen.  An incision was made and the needle was advanced into the stomach under direct visualization of difficulty.  A guidewire was then advanced and this was grasped with a snare and pulled out with retraction of the endoscope.  A 20 French gastrostomy tube was then attached to the guidewire and using the pull technique, the gastrostomy tube was positioned into place in the stomach.  The tube was at the 3 cm Gurtej Noyola at the skin level.  The endoscope was then advanced back down into the stomach and confirmation of placement was performed.  All fluid and air were then evacuated from the stomach and esophagus prior to removal of the endoscope.  A bolster was then placed in Neosporin ointment and dry sterile dressing were applied around the gastrostomy tube.  All tape and needle counts were  correct at the end of the procedure.  The patient was extubated in the operating room and transferred to PACU in stable condition.  Complications: None  EBL: Minimal  Specimen: None

## 2018-10-01 NOTE — Anesthesia Postprocedure Evaluation (Signed)
Anesthesia Post Note Late Entry at 1135  Patient: Erika Turner  Procedure(s) Performed: ESOPHAGOGASTRODUODENOSCOPY (EGD) WITH PROPOFOL (N/A Esophagus) PERCUTANEOUS ENDOSCOPIC GASTROSTOMY (PEG) PLACEMENT (N/A Abdomen)  Patient location during evaluation: PACU Anesthesia Type: General Level of consciousness: awake and alert and oriented Pain management: pain level controlled Vital Signs Assessment: post-procedure vital signs reviewed and stable Respiratory status: spontaneous breathing Cardiovascular status: stable Postop Assessment: no apparent nausea or vomiting Anesthetic complications: no     Last Vitals:  Vitals:   10/01/18 1130 10/01/18 1145  BP: 102/74 (!) 96/59  Pulse: (!) 110 (!) 107  Resp: (!) 29 (!) 22  Temp:    SpO2: 92% 96%    Last Pain:  Vitals:   10/01/18 1145  TempSrc:   PainSc: 5                  ADAMS, AMY A

## 2018-10-01 NOTE — Anesthesia Procedure Notes (Signed)
Procedure Name: Intubation Date/Time: 10/01/2018 10:24 AM Performed by: Andree Elk, Amy A, CRNA Pre-anesthesia Checklist: Patient identified, Patient being monitored, Timeout performed, Emergency Drugs available and Suction available Patient Re-evaluated:Patient Re-evaluated prior to induction Oxygen Delivery Method: Circle system utilized Preoxygenation: Pre-oxygenation with 100% oxygen Induction Type: IV induction Laryngoscope Size: 3 and Glidescope Grade View: Grade I Tube type: Oral Tube size: 6.0 mm Number of attempts: 1 Airway Equipment and Method: Stylet Placement Confirmation: ETT inserted through vocal cords under direct vision,  positive ETCO2 and breath sounds checked- equal and bilateral Secured at: 21 cm Tube secured with: Tape Dental Injury: Teeth and Oropharynx as per pre-operative assessment

## 2018-10-01 NOTE — Transfer of Care (Signed)
Immediate Anesthesia Transfer of Care Note  Patient: Erika Turner  Procedure(s) Performed: ESOPHAGOGASTRODUODENOSCOPY (EGD) WITH PROPOFOL (N/A Esophagus) PERCUTANEOUS ENDOSCOPIC GASTROSTOMY (PEG) PLACEMENT (N/A Abdomen)  Patient Location: PACU  Anesthesia Type:General  Level of Consciousness: awake, alert , oriented and patient cooperative  Airway & Oxygen Therapy: Patient Spontanous Breathing and Patient connected to face mask oxygen  Post-op Assessment: Report given to RN and Post -op Vital signs reviewed and stable  Post vital signs: Reviewed and stable  Last Vitals:  Vitals Value Taken Time  BP 128/81 10/01/2018 11:06 AM  Temp    Pulse 127 10/01/2018 11:12 AM  Resp 22 10/01/2018 11:12 AM  SpO2 92 % 10/01/2018 11:12 AM  Vitals shown include unvalidated device data.  Last Pain:  Vitals:   10/01/18 0911  TempSrc: Oral  PainSc:          Complications: No apparent anesthesia complications

## 2018-10-01 NOTE — Anesthesia Procedure Notes (Signed)
Performed by: Adrienna Karis A, CRNA       

## 2018-10-01 NOTE — Interval H&P Note (Signed)
History and Physical Interval Note:  10/01/2018 9:21 AM  Erika Turner  has presented today for surgery, with the diagnosis of lung cancer  The various methods of treatment have been discussed with the patient and family. After consideration of risks, benefits and other options for treatment, the patient has consented to  Procedure(s): ESOPHAGOGASTRODUODENOSCOPY (EGD) WITH PROPOFOL (N/A) PERCUTANEOUS ENDOSCOPIC GASTROSTOMY (PEG) PLACEMENT (N/A) as a surgical intervention .  The patient's history has been reviewed, patient examined, no change in status, stable for surgery.  I have reviewed the patient's chart and labs.  Questions were answered to the patient's satisfaction.     Aviva Signs

## 2018-10-04 ENCOUNTER — Encounter (HOSPITAL_COMMUNITY): Payer: Self-pay | Admitting: General Surgery

## 2018-10-05 ENCOUNTER — Inpatient Hospital Stay (HOSPITAL_COMMUNITY): Payer: Managed Care, Other (non HMO) | Attending: Hematology

## 2018-10-05 ENCOUNTER — Inpatient Hospital Stay (HOSPITAL_COMMUNITY): Payer: Managed Care, Other (non HMO) | Admitting: Dietician

## 2018-10-05 DIAGNOSIS — R61 Generalized hyperhidrosis: Secondary | ICD-10-CM | POA: Insufficient documentation

## 2018-10-05 DIAGNOSIS — R531 Weakness: Secondary | ICD-10-CM | POA: Insufficient documentation

## 2018-10-05 DIAGNOSIS — R634 Abnormal weight loss: Secondary | ICD-10-CM | POA: Insufficient documentation

## 2018-10-05 DIAGNOSIS — M545 Low back pain: Secondary | ICD-10-CM | POA: Insufficient documentation

## 2018-10-05 DIAGNOSIS — R05 Cough: Secondary | ICD-10-CM | POA: Insufficient documentation

## 2018-10-05 DIAGNOSIS — F17210 Nicotine dependence, cigarettes, uncomplicated: Secondary | ICD-10-CM | POA: Insufficient documentation

## 2018-10-05 DIAGNOSIS — K5900 Constipation, unspecified: Secondary | ICD-10-CM | POA: Insufficient documentation

## 2018-10-05 DIAGNOSIS — R1310 Dysphagia, unspecified: Secondary | ICD-10-CM | POA: Insufficient documentation

## 2018-10-05 DIAGNOSIS — R530 Neoplastic (malignant) related fatigue: Secondary | ICD-10-CM | POA: Insufficient documentation

## 2018-10-05 DIAGNOSIS — Z931 Gastrostomy status: Secondary | ICD-10-CM | POA: Insufficient documentation

## 2018-10-05 DIAGNOSIS — C3490 Malignant neoplasm of unspecified part of unspecified bronchus or lung: Secondary | ICD-10-CM | POA: Insufficient documentation

## 2018-10-05 DIAGNOSIS — Z79899 Other long term (current) drug therapy: Secondary | ICD-10-CM | POA: Insufficient documentation

## 2018-10-05 DIAGNOSIS — Z5111 Encounter for antineoplastic chemotherapy: Secondary | ICD-10-CM | POA: Insufficient documentation

## 2018-10-05 DIAGNOSIS — Z791 Long term (current) use of non-steroidal anti-inflammatories (NSAID): Secondary | ICD-10-CM | POA: Insufficient documentation

## 2018-10-05 DIAGNOSIS — R112 Nausea with vomiting, unspecified: Secondary | ICD-10-CM

## 2018-10-05 DIAGNOSIS — R140 Abdominal distension (gaseous): Secondary | ICD-10-CM | POA: Insufficient documentation

## 2018-10-05 MED ORDER — SCOPOLAMINE 1 MG/3DAYS TD PT72: 1.0000 | MEDICATED_PATCH | TRANSDERMAL | 12 refills | Status: AC

## 2018-10-05 NOTE — Progress Notes (Signed)
Chemotherapy education packet given and discussed with pt and family in detail.  Discussed diagnosis and staging, and tx regimen.  Reviewed chemotherapy medications and side effects, as well as pre-medications.  Instructed on how to manage side effects at home, and when to call the clinic.  Importance of fever/chills discussed with pt and family. Discussed precautions to implement at home after receiving tx, as well as self care strategies. Phone numbers provided for clinic during regular working hours, also how to reach the clinic after hours and on weekends. Pt and family provided the opportunity to ask questions - all questions answered to pt's and family satisfaction. Consent for chemotherapy obtained.

## 2018-10-05 NOTE — Progress Notes (Signed)
Nutrition Follow up  ASSESSMENT:  63 y/o female. Active smoker w/ 40 pack year hx. Recently developed supraclavicular lymphadenopathy and associated dysphagia. PCP ordered CT chest which showed adenopathy throughout thorax w/ f/u PET confirming hypermetabolic properties. Biopsy 12/30. + for metastatic adenocarcinoma. Begun short 10-dose course pallaitive radiation 1/14. S/P peg placement 1/31  Pt had her feeding tube placed last Friday. Her initial recommendations for tube feeding were as follows:  1 can Osmolite 1.5 QID w/ 30 ml Promod. +60 ml flush before / after each bolus, though she was to start at half-boluses given her prolonged period with poor intake.   Unfortunately, there has been a lot of issues with the home health agency. RD had received page late on 2/1 from pts house - the home health agency had not shown up to bring patient supplies. RD helped get them in contact with [facility] rep. Ultimately, pt did not get her supplies until noon 2/2 and, as of today they still have not received any visit from home health nursing.  Pt and family ultimately decided to administer the feeding on their own, based on instruction/demonstration RD had given them at Wednesdays Visit. Pt proudly reports they were successful and were able to administer each feeding in just a few minutes. Pt says she has been administering 3 oz boluses. Yesterday, her first complete day with tube feeding, she administered a total of 8 oz during the day (a 2 oz bolus and 2, 3 oz boluses). She has been flushing with 60 cc before and after. They have not been infusing the Promod- they are unsure if this was even delivered to them as they dont remember seeing it with the supplies.   Tolerance wise, she says she tolerated feeding extremely well. She says she felt "no different" after infusing the small boluses. She denies any n/v, distension or pain with infusions. She says she had her first bowel movement in over 5 days yesterday. She  says it was loose, slightly water and there was a lot of gas. She says this BM did NOT occur immediately after a bolus.   Orally, she reports having "bean soup" last night, which was made by her sister. She says she did well with this. She is seen drinking water and gingerale.   She reports significant back pain and asks RD if she can put her liquid ibuprofen through the tube. She does not want to take the Hydrocodone- "Im not a druggy, I dont want to take opioids". She has not yet gotten her scopolamine patch- nurse is addressing this, even though she says she hasn't been as nauseated recently.   In contrast w/ prior appts, She is in visibly good spirits today, smiling and laughing.   She was not weighed today as she was only here for chemo teaching. RD felt this did not need to be donea s it is too early to judge TFs impact. She will be weighed in 2 days when she receives first chemo treatment. Based on her last office wt 1/31. She has lost 18.4 lbs (>19% bw) since her initial cancer center appt on 12/17. Going back a month further, she was 98.2 lbs in November. This is a loss of 22.2 lbs or 22.6% of her BW. Per chart, her UBW appears to be 98-102 lbs which, prior to December, she had been stable at x5 years.   She has finished her short, palliative 10-dose course of radiation. Since the radiation, she has noticed substantial improvement in her  SOB, but unfortunately has not had any improvement in her dysphagia.   Wt Readings from Last 10 Encounters:  10/01/18 75 lb 13.4 oz (34.4 kg)  09/29/18 76 lb (34.5 kg)  09/23/18 82 lb (37.2 kg)  09/21/18 82 lb (37.2 kg)  09/17/18 87 lb 15.4 oz (39.9 kg)  09/07/18 88 lb (39.9 kg)  08/24/18 91 lb 9.6 oz (41.5 kg)  08/17/18 94 lb 6.4 oz (42.8 kg)  08/10/18 95 lb (43.1 kg)  07/21/18 98 lb 3.2 oz (44.5 kg)   MEDICATIONS:  Chemotherapy: carboplatin and paclitaxel -first dose 2/6 Supportive meds: Hydrocodone, compazine, diflucan, Scopolamine  LABS:  Na:  133 - will receive IVF, Albumin 3.8  Recent Labs  Lab 09/29/18 0838  NA 133*  K 3.8  CL 92*  CO2 30  BUN 9  CREATININE 0.43*  CALCIUM 9.3  MG 2.2  GLUCOSE 127*   ANTHROPOMETRICS: Height:  Ht Readings from Last 1 Encounters:  10/01/18 '5\' 2"'$  (1.575 m)   Weight:  Wt Readings from Last 1 Encounters:  10/01/18 75 lb 13.4 oz (34.4 kg)   BMI:  BMI Readings from Last 1 Encounters:  10/01/18 13.87 kg/m   UBW: Was 98-102 lbs x 5 years prior to December.  -18.4 lbs x1.5 month (94.4 at initial [facility] visit)  IBW: 50  ESTIMATED ENERGY NEEDS:  Kcal: 1400-1550 kcals (40-45 kcal/kg bw) Protein: >70g Pro (2 g/kg bw) Fluid: >1.3 L fluid (35 ml/kg bw)  NUTRITION DIAGNOSIS:  Severe malnutrition related to cancer and related dysphagia/postprandial vomiting as evidenced by loss of >19% bw x1.5 months and an estimated intake that has met </= to 50% of needs for >/= 1 month.   DOCUMENTATION CODES:  Severe malnutrition in acute (based on above parameters). This is likely to eventually become a chronic dx  INTERVENTION:    Initial TF regimen: 1 can Osmolite 1.5 QID w/ 30 ml Promod. +60 ml flush before / after each bolus -Progressing to goal-  TF will provide: 1520 kcals, 70g Pro, 724 ml free water (+480 from flushes for total of 1204 mls h20)  Given her tolerance, RD encouraged her to continue to titrate her feedings up. RD asked her to try to get up to 16 oz/day by Thursday. Spouse/pt report understanding the goal. They will check again for the promod when they get back home.   RD had prolonged discussion with [facility] representative regarding above noted issues. She is closely following up.  RD reviewed how to administer medications via PEG. She says she is in a lot of pain at night. She is hesitant to use the prescribed norco. RD eased patients concerns. She should not worry about becoming a "druggy" by taking her medications as prescribed. If her pain is not controlled w/ liquid ibuprofen  and it causing her to loose sleep and negatively impacting other areas, she should take it. Pt notes understanding.   RD will briefly see pt Thursday.    GOAL:  Oral intake to meet >90% of needs, weight stability  MONITOR:  Weight, oral intake, level of dysphagia/diet tolerance, treatment plan,  TF tolerance  Next Visit:  Thursday 2/6  Burtis Junes RD, LDN, CNSC Clinical Nutrition Available Tues-Sat via Pager: [phone] 10/05/2018 9:37 AM

## 2018-10-07 ENCOUNTER — Other Ambulatory Visit: Payer: Self-pay

## 2018-10-07 ENCOUNTER — Encounter (HOSPITAL_COMMUNITY): Payer: Self-pay | Admitting: Dietician

## 2018-10-07 ENCOUNTER — Inpatient Hospital Stay (HOSPITAL_COMMUNITY): Payer: Managed Care, Other (non HMO)

## 2018-10-07 ENCOUNTER — Inpatient Hospital Stay (HOSPITAL_BASED_OUTPATIENT_CLINIC_OR_DEPARTMENT_OTHER): Payer: Managed Care, Other (non HMO) | Admitting: Hematology

## 2018-10-07 ENCOUNTER — Encounter (HOSPITAL_COMMUNITY): Payer: Self-pay | Admitting: Hematology

## 2018-10-07 VITALS — BP 95/59 | HR 96 | Temp 98.2°F | Resp 16

## 2018-10-07 VITALS — BP 90/60 | HR 110 | Temp 97.8°F | Resp 18 | Wt 78.4 lb

## 2018-10-07 DIAGNOSIS — C3490 Malignant neoplasm of unspecified part of unspecified bronchus or lung: Secondary | ICD-10-CM

## 2018-10-07 DIAGNOSIS — R634 Abnormal weight loss: Secondary | ICD-10-CM | POA: Diagnosis not present

## 2018-10-07 DIAGNOSIS — Z79899 Other long term (current) drug therapy: Secondary | ICD-10-CM | POA: Diagnosis not present

## 2018-10-07 DIAGNOSIS — R140 Abdominal distension (gaseous): Secondary | ICD-10-CM | POA: Diagnosis not present

## 2018-10-07 DIAGNOSIS — F17210 Nicotine dependence, cigarettes, uncomplicated: Secondary | ICD-10-CM | POA: Diagnosis not present

## 2018-10-07 DIAGNOSIS — R531 Weakness: Secondary | ICD-10-CM | POA: Diagnosis not present

## 2018-10-07 DIAGNOSIS — Z791 Long term (current) use of non-steroidal anti-inflammatories (NSAID): Secondary | ICD-10-CM | POA: Diagnosis not present

## 2018-10-07 DIAGNOSIS — R61 Generalized hyperhidrosis: Secondary | ICD-10-CM | POA: Diagnosis not present

## 2018-10-07 DIAGNOSIS — K5900 Constipation, unspecified: Secondary | ICD-10-CM | POA: Diagnosis not present

## 2018-10-07 DIAGNOSIS — R05 Cough: Secondary | ICD-10-CM

## 2018-10-07 DIAGNOSIS — M545 Low back pain: Secondary | ICD-10-CM

## 2018-10-07 DIAGNOSIS — Z931 Gastrostomy status: Secondary | ICD-10-CM | POA: Diagnosis not present

## 2018-10-07 DIAGNOSIS — R530 Neoplastic (malignant) related fatigue: Secondary | ICD-10-CM

## 2018-10-07 DIAGNOSIS — R1310 Dysphagia, unspecified: Secondary | ICD-10-CM | POA: Diagnosis not present

## 2018-10-07 DIAGNOSIS — R112 Nausea with vomiting, unspecified: Secondary | ICD-10-CM

## 2018-10-07 DIAGNOSIS — Z5111 Encounter for antineoplastic chemotherapy: Secondary | ICD-10-CM | POA: Diagnosis not present

## 2018-10-07 LAB — PHOSPHORUS: Phosphorus: 2.9 mg/dL (ref 2.5–4.6)

## 2018-10-07 LAB — COMPREHENSIVE METABOLIC PANEL
ALK PHOS: 71 U/L (ref 38–126)
ALT: 15 U/L (ref 0–44)
AST: 18 U/L (ref 15–41)
Albumin: 3.3 g/dL — ABNORMAL LOW (ref 3.5–5.0)
Anion gap: 7 (ref 5–15)
BILIRUBIN TOTAL: 0.2 mg/dL — AB (ref 0.3–1.2)
BUN: 10 mg/dL (ref 6–20)
CO2: 29 mmol/L (ref 22–32)
Calcium: 8.7 mg/dL — ABNORMAL LOW (ref 8.9–10.3)
Chloride: 96 mmol/L — ABNORMAL LOW (ref 98–111)
Creatinine, Ser: 0.39 mg/dL — ABNORMAL LOW (ref 0.44–1.00)
GFR calc Af Amer: >60 mL/min (ref >60)
GFR calc non Af Amer: >60 mL/min (ref >60)
Glucose, Bld: 107 mg/dL — ABNORMAL HIGH (ref 70–99)
Potassium: 3.9 mmol/L (ref 3.5–5.1)
Sodium: 132 mmol/L — ABNORMAL LOW (ref 135–145)
Total Protein: 6.4 g/dL — ABNORMAL LOW (ref 6.5–8.1)

## 2018-10-07 LAB — CBC WITH DIFFERENTIAL/PLATELET
Abs Immature Granulocytes: 0.03 10*3/uL (ref 0.00–0.07)
Basophils Absolute: 0 10*3/uL (ref 0.0–0.1)
Basophils Relative: 0 %
EOS PCT: 4 %
Eosinophils Absolute: 0.4 10*3/uL (ref 0.0–0.5)
HCT: 37.3 % (ref 36.0–46.0)
HEMOGLOBIN: 11.4 g/dL — AB (ref 12.0–15.0)
Immature Granulocytes: 0 %
Lymphocytes Relative: 2 %
Lymphs Abs: 0.2 10*3/uL — ABNORMAL LOW (ref 0.7–4.0)
MCH: 26.9 pg (ref 26.0–34.0)
MCHC: 30.6 g/dL (ref 30.0–36.0)
MCV: 88 fL (ref 80.0–100.0)
Monocytes Absolute: 0.8 10*3/uL (ref 0.1–1.0)
Monocytes Relative: 8 %
Neutro Abs: 8.2 10*3/uL — ABNORMAL HIGH (ref 1.7–7.7)
Neutrophils Relative %: 86 %
Platelets: 257 10*3/uL (ref 150–400)
RBC: 4.24 MIL/uL (ref 3.87–5.11)
RDW: 14.1 % (ref 11.5–15.5)
WBC: 9.7 10*3/uL (ref 4.0–10.5)
nRBC: 0 % (ref 0.0–0.2)

## 2018-10-07 LAB — MAGNESIUM: Magnesium: 2.2 mg/dL (ref 1.7–2.4)

## 2018-10-07 LAB — LACTATE DEHYDROGENASE: LDH: 166 U/L (ref 98–192)

## 2018-10-07 MED ORDER — PALONOSETRON HCL INJECTION 0.25 MG/5ML
0.2500 mg | Freq: Once | INTRAVENOUS | Status: AC
  Administered 2018-10-07: 0.25 mg via INTRAVENOUS

## 2018-10-07 MED ORDER — ONDANSETRON 4 MG PO TBDP: 4.0000 mg | ORAL_TABLET | Freq: Three times a day (TID) | ORAL | 0 refills | Status: AC | PRN

## 2018-10-07 MED ORDER — FAMOTIDINE IN NACL 20-0.9 MG/50ML-% IV SOLN
INTRAVENOUS | Status: AC
  Filled 2018-10-07: qty 50

## 2018-10-07 MED ORDER — DIPHENHYDRAMINE HCL 50 MG/ML IJ SOLN
50.0000 mg | Freq: Once | INTRAMUSCULAR | Status: AC
  Administered 2018-10-07: 50 mg via INTRAVENOUS

## 2018-10-07 MED ORDER — SODIUM CHLORIDE 0.9 % IV SOLN
Freq: Once | INTRAVENOUS | Status: AC
  Administered 2018-10-07: 10:00:00 via INTRAVENOUS

## 2018-10-07 MED ORDER — DIPHENHYDRAMINE HCL 50 MG/ML IJ SOLN
INTRAMUSCULAR | Status: AC
  Filled 2018-10-07: qty 1

## 2018-10-07 MED ORDER — SODIUM CHLORIDE 0.9% FLUSH
10.0000 mL | INTRAVENOUS | Status: DC | PRN
  Administered 2018-10-07: 10 mL
  Filled 2018-10-07: qty 10

## 2018-10-07 MED ORDER — HEPARIN SOD (PORK) LOCK FLUSH 100 UNIT/ML IV SOLN
500.0000 [IU] | Freq: Once | INTRAVENOUS | Status: AC
  Administered 2018-10-07: 500 [IU] via INTRAVENOUS

## 2018-10-07 MED ORDER — HEPARIN SOD (PORK) LOCK FLUSH 100 UNIT/ML IV SOLN
500.0000 [IU] | Freq: Once | INTRAVENOUS | Status: AC | PRN
  Administered 2018-10-07: 500 [IU]
  Filled 2018-10-07: qty 5

## 2018-10-07 MED ORDER — SODIUM CHLORIDE 0.9 % IV SOLN
45.0000 mg/m2 | Freq: Once | INTRAVENOUS | Status: AC
  Administered 2018-10-07: 60 mg via INTRAVENOUS
  Filled 2018-10-07: qty 10

## 2018-10-07 MED ORDER — PALONOSETRON HCL INJECTION 0.25 MG/5ML
INTRAVENOUS | Status: AC
  Filled 2018-10-07: qty 5

## 2018-10-07 MED ORDER — FAMOTIDINE IN NACL 20-0.9 MG/50ML-% IV SOLN
20.0000 mg | Freq: Once | INTRAVENOUS | Status: AC
  Administered 2018-10-07: 20 mg via INTRAVENOUS

## 2018-10-07 MED ORDER — SODIUM CHLORIDE 0.9 % IV SOLN
20.0000 mg | Freq: Once | INTRAVENOUS | Status: AC
  Administered 2018-10-07: 20 mg via INTRAVENOUS
  Filled 2018-10-07: qty 20

## 2018-10-07 MED ORDER — SODIUM CHLORIDE 0.9 % IV SOLN
100.0000 mg | Freq: Once | INTRAVENOUS | Status: AC
  Administered 2018-10-07: 100 mg via INTRAVENOUS
  Filled 2018-10-07: qty 10

## 2018-10-07 NOTE — Patient Instructions (Signed)
[facility] Discharge Instructions for Patients Receiving Chemotherapy  Today you received the following chemotherapy agents   To help prevent nausea and vomiting after your treatment, we encourage you to take your nausea medication   If you develop nausea and vomiting that is not controlled by your nausea medication, call the clinic.   BELOW ARE SYMPTOMS THAT SHOULD BE REPORTED IMMEDIATELY:  *FEVER GREATER THAN 100.5 F  *CHILLS WITH OR WITHOUT FEVER  NAUSEA AND VOMITING THAT IS NOT CONTROLLED WITH YOUR NAUSEA MEDICATION  *UNUSUAL SHORTNESS OF BREATH  *UNUSUAL BRUISING OR BLEEDING  TENDERNESS IN MOUTH AND THROAT WITH OR WITHOUT PRESENCE OF ULCERS  *URINARY PROBLEMS  *BOWEL PROBLEMS  UNUSUAL RASH Items with * indicate a potential emergency and should be followed up as soon as possible.  Feel free to call the clinic should you have any questions or concerns. The clinic phone number is [phone].  Please show the CHEMO ALERT CARD at check-in to the Emergency Department and triage nurse.   

## 2018-10-07 NOTE — Progress Notes (Signed)
Pt presents today for Day 1 Taxol/ Keytruda. VSS. MAR reviewed. Pt seen by Dr. Delton Coombes today. No complaints of pain or any changes since the last visit.   Treatment given today per MD orders. Tolerated infusion without adverse affects. Vital signs stable. No complaints at this time. Discharged from clinic ambulatory. F/U with [facility] as scheduled.

## 2018-10-07 NOTE — Assessment & Plan Note (Signed)
1.  Non-small cell lung cancer (Tx N3 M0), adenocarcinoma: - Patient noticed lymphadenopathy in the neck region in October 2019.  She also has night sweats but denies any fevers or significant weight loss. - Difficulty swallowing for the past few weeks.  Drinking Ensure 1 to 2 cans/day. - CT of the chest without contrast on 08/12/2018 shows bulky right hilar and mediastinal adenopathy with bilateral supraclavicular adenopathy, scattered small lung lesions nonspecific.  1.2 cm liver lesion incompletely visualized, may represent cyst, hemangioma. -Physical exam reveals bilateral supraclavicular adenopathy. - She had a biopsy of the left supra clavicular lymph node on 08/30/2018 which showed metastatic poorly differentiated adenocarcinoma consistent with lung primary.  Tumor was strongly positive for CK7 and TTF-1.  Weak positivity for Napsin-A and MOC-31. - She is not eating much.  She lost about 5 pounds in the last 2 weeks.  She eats mashed potatoes and cooked squash.  She is drinking 7-Up and cranberry juice.  She is not able to tolerate Ensure or boost because of the sweetness. - PET/CT scan on 08/23/2018 shows lymphadenopathy in the bilateral supraclavicular regions, mediastinum, subcarinal, right hilar more than left hilar with no pulmonary pathology.  No subdiaphragmatic disease seen.   -MRI of the brain without contrast on 08/24/2018 shows several small areas of nonspecific T2/flair hyperintensity in the cerebral white matter, some with mild T2 shine through on diffusion. -CT scan of the brain with and without contrast on 09/17/2018 shows no intracranial metastasis. -She was started on palliative radiation on 09/14/2018.  She cannot lay flat on the table.  She is being treated in the left lateral position.  She reported her cough has slightly improved after radiation started. - She finished palliative radiation on 09/28/2018. - We talked about chemotherapy with weekly carboplatin and paclitaxel in  detail.  We talked about side effects extensively. - She was put on scopolamine patch today.  We will give her prescription for Zofran 4 mg sublingually as needed.  She has Compazine tablets but cannot swallow them. - We have reviewed her blood work today.  She may proceed with first cycle of carboplatin and paclitaxel. -We will reevaluate her in 1 week.  2.  Low back pain: - She is using Motrin in liquid form as needed via PEG.  She is not requiring it every day.  3.  Weight loss: -She had a PEG tube placed on 10/01/2018. -She started tube feeds on 10/03/2018.  She is tolerating bolus of Isosource 1.54 ounces up to 3 times a day.  She is also taking in 1 ounce of ProMod every day.  Her weight improved by 3 pounds.  She is also feeling better. She will follow-up with Ovid Curd, our dietitian and follow his recommendations.

## 2018-10-07 NOTE — Progress Notes (Signed)
labs reviewed and she is ready for treatment.

## 2018-10-07 NOTE — Progress Notes (Signed)
Nutrition Brief Note  Briefly following up w/ pt-just seen 2 days ago.   She says she has gotten up to 11 oz of TF/d w/ 60 cc flush before/after. She did 3 oz yesterday morning, 4 oz at noon and another 4 oz in the evening w/ the promod. She says she ate dinner (bean soup) on top of her evening feed and developed abdominal pain shortly after. She attributes this to just having too much at one time.   RD had initial goal of 4 cans of TF/d (32 oz). It is quite apparent that she will not be able to tolerate this kind of volume in short term. Pt herself believes she will never be able to do this much-especially if she continues to eat orally.   RD encouraged her to continue titrating up as able. We will take it one week at a time and closely monitor her weight. RD noted he cares more about her continuing to gain wt/wound rather than the amount of TF she can infuse. For this next week, she believes she will be able to get up to 5 oz TID + 1 oz promod. She denies n/v/c/d.   She says she has started developing a wound on her back side-emphasized importance of nutrition and closely monitoring this- if this worsens she may need to increase promod intake.    RD to see during infusion next Rogers RD, LDN, CNSC Clinical Nutrition Available Tues-Sat via Pager: [phone] 10/07/2018 11:16 AM

## 2018-10-07 NOTE — Progress Notes (Signed)
[address], [place] [address]   CLINIC:  Medical Oncology/Hematology  PCP:  Sharion Balloon, [place] [address] [phone]   REASON FOR VISIT: Follow-up for adenocarcinoma of the lung  CURRENT THERAPY:chemoradiationCarboplatin,paclitaxel weekly  BRIEF ONCOLOGIC HISTORY:    Adenocarcinoma of lung (Erika Turner)   09/07/2018 Initial Diagnosis    Adenocarcinoma of lung ([name])    10/07/2018 -  Chemotherapy    The patient had palonosetron (ALOXI) injection 0.25 mg, 0.25 mg, Intravenous,  Once, 1 of 4 cycles Administration: 0.25 mg (10/07/2018) CARBOplatin (PARAPLATIN) 100 mg in sodium chloride 0.9 % 100 mL chemo infusion, 100 mg (100 % of original dose 100 mg), Intravenous,  Once, 1 of 4 cycles Dose modification: 100 mg (original dose 100 mg, Cycle 1, Reason: Provider Judgment) Administration: 100 mg (10/07/2018) PACLitaxel (TAXOL) 60 mg in sodium chloride 0.9 % 150 mL chemo infusion (</= 80mg /m2), 45 mg/m2 = 60 mg, Intravenous,  Once, 1 of 4 cycles Administration: 60 mg (10/07/2018)  for chemotherapy treatment.         INTERVAL HISTORY:  Ms. Erika Turner 63 y.o. female returns for routine follow-up for adenocarcinoma of the lung. She is here today with her husband. She had her PEG tube placed and started feedings. She has gained 3 pound today and is feeling a little better. She reports she could only take a small feeding but she will slowly increase the amount and she will meet back with the nutritionalist. Denies any nausea, vomiting, or diarrhea. Denies any new pains. Had not noticed any recent bleeding such as epistaxis, hematuria or hematochezia. Denies recent chest pain on exertion, shortness of breath on minimal exertion, pre-syncopal episodes, or palpitations. Denies any numbness or tingling in hands or feet. Denies any recent fevers, infections, or recent hospitalizations. Patient reports appetite at 25% and energy level at 50%.      REVIEW OF SYSTEMS:  Review of Systems  Constitutional: Positive for fatigue.  Neurological: Positive for extremity weakness.  All other systems reviewed and are negative.    PAST MEDICAL/SURGICAL HISTORY:  Past Medical History:  Diagnosis Date  . GAD (generalized anxiety disorder) 01/21/2017  . Lymphadenopathy   . Underweight 01/21/2017   Past Surgical History:  Procedure Laterality Date  . ABDOMINAL HYSTERECTOMY    . CESAREAN SECTION     x 2  . ESOPHAGOGASTRODUODENOSCOPY (EGD) WITH PROPOFOL N/A 10/01/2018   Procedure: ESOPHAGOGASTRODUODENOSCOPY (EGD) WITH PROPOFOL;  Surgeon: Aviva Signs, MD;  Location: [facility];  Service: General;  Laterality: N/A;  . LYMPH NODE BIOPSY Left 08/30/2018   Procedure: CERVICAL LYMPH NODE BIOPSY;  Surgeon: Aviva Signs, MD;  Location: [facility];  Service: General;  Laterality: Left;  . PEG PLACEMENT N/A 10/01/2018   Procedure: PERCUTANEOUS ENDOSCOPIC GASTROSTOMY (PEG) PLACEMENT;  Surgeon: Aviva Signs, MD;  Location: [facility];  Service: General;  Laterality: N/A;  . PORTACATH PLACEMENT Right 09/17/2018   Procedure: INSERTION PORT-A-CATH (ATTACHED CATHETER IN RIGHT SUBCLAVIAN);  Surgeon: Aviva Signs, MD;  Location: [facility];  Service: General;  Laterality: Right;     SOCIAL HISTORY:  Social History   Socioeconomic History  . Marital status: Married    Spouse name: Not on file  . Number of children: 2  . Years of education: Not on file  . Highest education level: Not on file  Occupational History  . Occupation: housekeeper  Social Needs  . Financial resource strain: Not hard at all  . Food insecurity:  Worry: Never true    Inability: Never true  . Transportation needs:    Medical: No    Non-medical: No  Tobacco Use  . Smoking status: Current Every Day Smoker    Packs/day: 1.00    Years: 40.00    Pack years: 40.00    Types: Cigarettes    Start date: 11/02/1978  . Smokeless tobacco: Never Used  Substance and Sexual Activity  . Alcohol  use: No  . Drug use: No  . Sexual activity: Not on file  Lifestyle  . Physical activity:    Days per week: 0 days    Minutes per session: 0 min  . Stress: To some extent  Relationships  . Social connections:    Talks on phone: Twice a week    Gets together: Never    Attends religious service: Never    Active member of club or organization: No    Attends meetings of clubs or organizations: Never    Relationship status: Married  . Intimate partner violence:    Fear of current or ex partner: No    Emotionally abused: No    Physically abused: No    Forced sexual activity: No  Other Topics Concern  . Not on file  Social History Narrative   Housekeeping at [employer].    FAMILY HISTORY:  Family History  Problem Relation Age of Onset  . Diabetes Mother   . Heart disease Mother        Pacemaker   . Cancer Father        Lung  . Cancer Brother        bladder then colon  . Down syndrome Brother   . Mental illness Daughter     CURRENT MEDICATIONS:  Outpatient Encounter Medications as of 10/07/2018  Medication Sig  . CARBOPLATIN IV Inject into the vein once a week.  . diphenhydramine-acetaminophen (TYLENOL PM) 25-500 MG TABS tablet Take 1 tablet by mouth at bedtime.   Marland Kitchen escitalopram (LEXAPRO) 10 MG tablet Take 1 tablet (10 mg total) by mouth daily.  Marland Kitchen HYDROcodone-acetaminophen (NORCO) 5-325 MG tablet Take 1 tablet by mouth every 6 (six) hours as needed for moderate pain.  Marland Kitchen ibuprofen (ADVIL,MOTRIN) 200 MG tablet Take 200-400 mg by mouth 2 (two) times daily as needed for moderate pain.  . Ipratropium-Albuterol (COMBIVENT) 20-100 MCG/ACT AERS respimat Inhale 1 puff into the lungs every 6 (six) hours.  . lidocaine-prilocaine (EMLA) cream Apply to affected area once  . Naphazoline-Glycerin (REDNESS RELIEF OP) Place 1 drop into both eyes daily.  Marland Kitchen PACLitaxel (TAXOL IV) Inject into the vein once a week.  . prochlorperazine (COMPAZINE) 10 MG tablet Take 1 tablet (10 mg total) by  mouth every 6 (six) hours as needed for nausea or vomiting.  Marland Kitchen scopolamine (TRANSDERM-SCOP) 1 MG/3DAYS Place 1 patch (1.5 mg total) onto the skin every 3 (three) days.  Marland Kitchen Spacer/Aero-Holding Chambers (BREATHERITE COLL SPACER ADULT) MISC 1 Device by Does not apply route every 6 (six) hours.  . [DISCONTINUED] fluconazole (DIFLUCAN) 100 MG tablet   . ondansetron (ZOFRAN ODT) 4 MG disintegrating tablet Take 1 tablet (4 mg total) by mouth every 8 (eight) hours as needed for nausea or vomiting.   No facility-administered encounter medications on file as of 10/07/2018.     ALLERGIES:  Allergies  Allergen Reactions  . Biaxin [Clarithromycin] Nausea And Vomiting     PHYSICAL EXAM:  ECOG Performance status: 1  Vitals:   10/07/18 0800  BP: 90/60  Pulse: (!) 110  Resp: 18  Temp: 97.8 F (36.6 C)  SpO2: 99%   Filed Weights   10/07/18 0800  Weight: 78 lb 6 oz (35.6 kg)    Physical Exam Constitutional:      Appearance: Normal appearance. She is normal weight.  Cardiovascular:     Rate and Rhythm: Normal rate and regular rhythm.     Heart sounds: Normal heart sounds.  Pulmonary:     Effort: Pulmonary effort is normal.     Breath sounds: Normal breath sounds.  Musculoskeletal: Normal range of motion.  Skin:    General: Skin is warm and dry.  Neurological:     Mental Status: She is alert and oriented to person, place, and time. Mental status is at baseline.  Psychiatric:        Mood and Affect: Mood normal.        Behavior: Behavior normal.        Thought Content: Thought content normal.        Judgment: Judgment normal.      LABORATORY DATA:  I have reviewed the labs as listed.  CBC    Component Value Date/Time   WBC 9.7 10/07/2018 0821   RBC 4.24 10/07/2018 0821   HGB 11.4 (L) 10/07/2018 0821   HGB 12.5 07/02/2018 1847   HCT 37.3 10/07/2018 0821   HCT 37.3 07/02/2018 1847   PLT 257 10/07/2018 0821   PLT 407 07/02/2018 1847   MCV 88.0 10/07/2018 0821   MCV 86  07/02/2018 1847   MCH 26.9 10/07/2018 0821   MCHC 30.6 10/07/2018 0821   RDW 14.1 10/07/2018 0821   RDW 12.4 07/02/2018 1847   LYMPHSABS 0.2 (L) 10/07/2018 0821   LYMPHSABS 2.1 07/02/2018 1847   MONOABS 0.8 10/07/2018 0821   EOSABS 0.4 10/07/2018 0821   EOSABS 0.1 07/02/2018 1847   BASOSABS 0.0 10/07/2018 0821   BASOSABS 0.1 07/02/2018 1847   CMP Latest Ref Rng & Units 10/07/2018 09/29/2018 09/07/2018  Glucose 70 - 99 mg/dL 107(H) 127(H) 122(H)  BUN 6 - 20 mg/dL 10 9 10   Creatinine 0.44 - 1.00 mg/dL 0.39(L) 0.43(L) 0.47  Sodium 135 - 145 mmol/L 132(L) 133(L) 136  Potassium 3.5 - 5.1 mmol/L 3.9 3.8 3.9  Chloride 98 - 111 mmol/L 96(L) 92(L) 96(L)  CO2 22 - 32 mmol/L 29 30 30   Calcium 8.9 - 10.3 mg/dL 8.7(L) 9.3 9.8  Total Protein 6.5 - 8.1 g/dL 6.4(L) 7.0 7.5  Total Bilirubin 0.3 - 1.2 mg/dL 0.2(L) 0.4 0.4  Alkaline Phos 38 - 126 U/L 71 81 79  AST 15 - 41 U/L 18 14(L) 15  ALT 0 - 44 U/L 15 10 10        DIAGNOSTIC IMAGING:  I have independently reviewed the scans and discussed with the patient.   I have reviewed Francene Finders, NP's note and agree with the documentation.  I personally performed a face-to-face visit, made revisions and my assessment and plan is as follows.    ASSESSMENT & PLAN:   Adenocarcinoma of lung ([place]) 1.  Non-small cell lung cancer (Tx N3 M0), adenocarcinoma: - Patient noticed lymphadenopathy in the neck region in October 2019.  She also has night sweats but denies any fevers or significant weight loss. - Difficulty swallowing for the past few weeks.  Drinking Ensure 1 to 2 cans/day. - CT of the chest without contrast on 08/12/2018 shows bulky right hilar and mediastinal adenopathy with bilateral supraclavicular adenopathy, scattered small lung lesions nonspecific.  1.2 cm liver lesion incompletely visualized, may represent cyst, hemangioma. -Physical exam reveals bilateral supraclavicular adenopathy. - She had a biopsy of the left supra clavicular lymph  node on 08/30/2018 which showed metastatic poorly differentiated adenocarcinoma consistent with lung primary.  Tumor was strongly positive for CK7 and TTF-1.  Weak positivity for Napsin-A and MOC-31. - She is not eating much.  She lost about 5 pounds in the last 2 weeks.  She eats mashed potatoes and cooked squash.  She is drinking 7-Up and cranberry juice.  She is not able to tolerate Ensure or boost because of the sweetness. - PET/CT scan on 08/23/2018 shows lymphadenopathy in the bilateral supraclavicular regions, mediastinum, subcarinal, right hilar more than left hilar with no pulmonary pathology.  No subdiaphragmatic disease seen.   -MRI of the brain without contrast on 08/24/2018 shows several small areas of nonspecific T2/flair hyperintensity in the cerebral white matter, some with mild T2 shine through on diffusion. -CT scan of the brain with and without contrast on 09/17/2018 shows no intracranial metastasis. -She was started on palliative radiation on 09/14/2018.  She cannot lay flat on the table.  She is being treated in the left lateral position.  She reported her cough has slightly improved after radiation started. - She finished palliative radiation on 09/28/2018. - We talked about chemotherapy with weekly carboplatin and paclitaxel in detail.  We talked about side effects extensively. - She was put on scopolamine patch today.  We will give her prescription for Zofran 4 mg sublingually as needed.  She has Compazine tablets but cannot swallow them. - We have reviewed her blood work today.  She may proceed with first cycle of carboplatin and paclitaxel. -We will reevaluate her in 1 week.  2.  Low back pain: - She is using Motrin in liquid form as needed via PEG.  She is not requiring it every day.  3.  Weight loss: -She had a PEG tube placed on 10/01/2018. -She started tube feeds on 10/03/2018.  She is tolerating bolus of Isosource 1.54 ounces up to 3 times a day.  She is also taking in 1  ounce of ProMod every day.  Her weight improved by 3 pounds.  She is also feeling better. She will follow-up with Ovid Curd, our dietitian and follow his recommendations.      Orders placed this encounter:  Orders Placed This Encounter  Procedures  . Phosphorus  . Magnesium  . CBC with Differential/Platelet  . Comprehensive metabolic panel  . Lactate dehydrogenase      Derek Jack, MD [place] [phone]

## 2018-10-07 NOTE — Patient Instructions (Signed)
[facility] at [facility] Discharge Instructions     Thank you for choosing [facility] at [facility] to provide your oncology and hematology care.  To afford each patient quality time with our provider, please arrive at least 15 minutes before your scheduled appointment time.   If you have a lab appointment with the Cancer Center please come in thru the  Main Entrance and check in at the main information desk  You need to re-schedule your appointment should you arrive 10 or more minutes late.  We strive to give you quality time with our providers, and arriving late affects you and other patients whose appointments are after yours.  Also, if you no show three or more times for appointments you may be dismissed from the clinic at the providers discretion.     Again, thank you for choosing [facility].  Our hope is that these requests will decrease the amount of time that you wait before being seen by our physicians.       _____________________________________________________________  Should you have questions after your visit to [facility], please contact our office at [phone] between the hours of 8:00 a.m. and 4:30 p.m.  Voicemails left after 4:00 p.m. will not be returned until the following business day.  For prescription refill requests, have your pharmacy contact our office and allow 72 hours.    Cancer Center Support Programs:   > Cancer Support Group  2nd Tuesday of the month 1pm-2pm, Journey Room    

## 2018-10-08 ENCOUNTER — Telehealth (HOSPITAL_COMMUNITY): Payer: Self-pay

## 2018-10-08 NOTE — Telephone Encounter (Signed)
24 hour call back:spoke with pts husband, states she is sleeping right now, no complaints or issues at this time.

## 2018-10-11 ENCOUNTER — Ambulatory Visit (INDEPENDENT_AMBULATORY_CARE_PROVIDER_SITE_OTHER): Payer: Managed Care, Other (non HMO)

## 2018-10-11 DIAGNOSIS — L89151 Pressure ulcer of sacral region, stage 1: Secondary | ICD-10-CM

## 2018-10-11 DIAGNOSIS — C3490 Malignant neoplasm of unspecified part of unspecified bronchus or lung: Secondary | ICD-10-CM | POA: Diagnosis not present

## 2018-10-11 DIAGNOSIS — Z431 Encounter for attention to gastrostomy: Secondary | ICD-10-CM

## 2018-10-11 DIAGNOSIS — R634 Abnormal weight loss: Secondary | ICD-10-CM

## 2018-10-11 DIAGNOSIS — J449 Chronic obstructive pulmonary disease, unspecified: Secondary | ICD-10-CM

## 2018-10-11 DIAGNOSIS — R599 Enlarged lymph nodes, unspecified: Secondary | ICD-10-CM

## 2018-10-11 DIAGNOSIS — R1310 Dysphagia, unspecified: Secondary | ICD-10-CM | POA: Diagnosis not present

## 2018-10-11 DIAGNOSIS — Z4800 Encounter for change or removal of nonsurgical wound dressing: Secondary | ICD-10-CM

## 2018-10-11 DIAGNOSIS — E43 Unspecified severe protein-calorie malnutrition: Secondary | ICD-10-CM | POA: Diagnosis not present

## 2018-10-11 DIAGNOSIS — Z7951 Long term (current) use of inhaled steroids: Secondary | ICD-10-CM

## 2018-10-11 DIAGNOSIS — F17210 Nicotine dependence, cigarettes, uncomplicated: Secondary | ICD-10-CM

## 2018-10-11 DIAGNOSIS — F413 Other mixed anxiety disorders: Secondary | ICD-10-CM

## 2018-10-14 ENCOUNTER — Inpatient Hospital Stay (HOSPITAL_COMMUNITY): Payer: Managed Care, Other (non HMO)

## 2018-10-14 ENCOUNTER — Encounter (HOSPITAL_COMMUNITY): Payer: Self-pay | Admitting: Dietician

## 2018-10-14 ENCOUNTER — Encounter (HOSPITAL_COMMUNITY): Payer: Self-pay | Admitting: Hematology

## 2018-10-14 ENCOUNTER — Inpatient Hospital Stay (HOSPITAL_BASED_OUTPATIENT_CLINIC_OR_DEPARTMENT_OTHER): Payer: Managed Care, Other (non HMO) | Admitting: Hematology

## 2018-10-14 ENCOUNTER — Other Ambulatory Visit: Payer: Self-pay

## 2018-10-14 VITALS — BP 109/51 | HR 93 | Temp 98.5°F | Resp 18

## 2018-10-14 DIAGNOSIS — R530 Neoplastic (malignant) related fatigue: Secondary | ICD-10-CM

## 2018-10-14 DIAGNOSIS — K5900 Constipation, unspecified: Secondary | ICD-10-CM | POA: Diagnosis not present

## 2018-10-14 DIAGNOSIS — C3490 Malignant neoplasm of unspecified part of unspecified bronchus or lung: Secondary | ICD-10-CM

## 2018-10-14 DIAGNOSIS — R531 Weakness: Secondary | ICD-10-CM

## 2018-10-14 DIAGNOSIS — R140 Abdominal distension (gaseous): Secondary | ICD-10-CM | POA: Diagnosis not present

## 2018-10-14 DIAGNOSIS — Z931 Gastrostomy status: Secondary | ICD-10-CM

## 2018-10-14 DIAGNOSIS — R634 Abnormal weight loss: Secondary | ICD-10-CM

## 2018-10-14 DIAGNOSIS — Z79899 Other long term (current) drug therapy: Secondary | ICD-10-CM

## 2018-10-14 DIAGNOSIS — R1310 Dysphagia, unspecified: Secondary | ICD-10-CM

## 2018-10-14 DIAGNOSIS — Z791 Long term (current) use of non-steroidal anti-inflammatories (NSAID): Secondary | ICD-10-CM

## 2018-10-14 DIAGNOSIS — F17210 Nicotine dependence, cigarettes, uncomplicated: Secondary | ICD-10-CM

## 2018-10-14 DIAGNOSIS — Z5111 Encounter for antineoplastic chemotherapy: Secondary | ICD-10-CM | POA: Diagnosis not present

## 2018-10-14 LAB — CBC WITH DIFFERENTIAL/PLATELET
Abs Immature Granulocytes: 0.01 10*3/uL (ref 0.00–0.07)
Basophils Absolute: 0 10*3/uL (ref 0.0–0.1)
Basophils Relative: 0 %
Eosinophils Absolute: 0.5 10*3/uL (ref 0.0–0.5)
Eosinophils Relative: 6 %
HCT: 33.2 % — ABNORMAL LOW (ref 36.0–46.0)
Hemoglobin: 10.2 g/dL — ABNORMAL LOW (ref 12.0–15.0)
Immature Granulocytes: 0 %
Lymphocytes Relative: 4 %
Lymphs Abs: 0.3 10*3/uL — ABNORMAL LOW (ref 0.7–4.0)
MCH: 27.3 pg (ref 26.0–34.0)
MCHC: 30.7 g/dL (ref 30.0–36.0)
MCV: 88.8 fL (ref 80.0–100.0)
Monocytes Absolute: 0.7 10*3/uL (ref 0.1–1.0)
Monocytes Relative: 9 %
NRBC: 0 % (ref 0.0–0.2)
Neutro Abs: 6.3 10*3/uL (ref 1.7–7.7)
Neutrophils Relative %: 81 %
Platelets: 392 10*3/uL (ref 150–400)
RBC: 3.74 MIL/uL — ABNORMAL LOW (ref 3.87–5.11)
RDW: 14.6 % (ref 11.5–15.5)
WBC: 7.8 10*3/uL (ref 4.0–10.5)

## 2018-10-14 LAB — COMPREHENSIVE METABOLIC PANEL
ALT: 12 U/L (ref 0–44)
AST: 18 U/L (ref 15–41)
Albumin: 3.3 g/dL — ABNORMAL LOW (ref 3.5–5.0)
Alkaline Phosphatase: 67 U/L (ref 38–126)
Anion gap: 9 (ref 5–15)
BUN: 12 mg/dL (ref 6–20)
CHLORIDE: 99 mmol/L (ref 98–111)
CO2: 27 mmol/L (ref 22–32)
Calcium: 8.7 mg/dL — ABNORMAL LOW (ref 8.9–10.3)
Creatinine, Ser: 0.3 mg/dL — ABNORMAL LOW (ref 0.44–1.00)
GFR calc non Af Amer: >60 mL/min (ref >60)
Glucose, Bld: 101 mg/dL — ABNORMAL HIGH (ref 70–99)
Potassium: 4.2 mmol/L (ref 3.5–5.1)
Sodium: 135 mmol/L (ref 135–145)
Total Bilirubin: 0.3 mg/dL (ref 0.3–1.2)
Total Protein: 6.4 g/dL — ABNORMAL LOW (ref 6.5–8.1)

## 2018-10-14 LAB — LACTATE DEHYDROGENASE: LDH: 177 U/L (ref 98–192)

## 2018-10-14 LAB — MAGNESIUM: Magnesium: 2.2 mg/dL (ref 1.7–2.4)

## 2018-10-14 MED ORDER — SODIUM CHLORIDE 0.9 % IV SOLN: 20.0000 mg | Freq: Once | INTRAVENOUS | Status: DC

## 2018-10-14 MED ORDER — FAMOTIDINE IN NACL 20-0.9 MG/50ML-% IV SOLN
20.0000 mg | Freq: Once | INTRAVENOUS | Status: AC
  Administered 2018-10-14: 20 mg via INTRAVENOUS
  Filled 2018-10-14: qty 50

## 2018-10-14 MED ORDER — DIPHENHYDRAMINE HCL 50 MG/ML IJ SOLN
50.0000 mg | Freq: Once | INTRAMUSCULAR | Status: AC
  Administered 2018-10-14: 50 mg via INTRAVENOUS
  Filled 2018-10-14: qty 1

## 2018-10-14 MED ORDER — PALONOSETRON HCL INJECTION 0.25 MG/5ML
0.2500 mg | Freq: Once | INTRAVENOUS | Status: AC
  Administered 2018-10-14: 0.25 mg via INTRAVENOUS
  Filled 2018-10-14: qty 5

## 2018-10-14 MED ORDER — SODIUM CHLORIDE 0.9 % IV SOLN
45.0000 mg/m2 | Freq: Once | INTRAVENOUS | Status: AC
  Administered 2018-10-14: 60 mg via INTRAVENOUS
  Filled 2018-10-14: qty 10

## 2018-10-14 MED ORDER — SODIUM CHLORIDE 0.9 % IV SOLN
20.0000 mg | Freq: Once | INTRAVENOUS | Status: AC
  Administered 2018-10-14: 20 mg via INTRAVENOUS
  Filled 2018-10-14: qty 20

## 2018-10-14 MED ORDER — SODIUM CHLORIDE 0.9% FLUSH
10.0000 mL | INTRAVENOUS | Status: DC | PRN
  Administered 2018-10-14: 10 mL
  Filled 2018-10-14: qty 10

## 2018-10-14 MED ORDER — SODIUM CHLORIDE 0.9 % IV SOLN
137.8000 mg | Freq: Once | INTRAVENOUS | Status: AC
  Administered 2018-10-14: 140 mg via INTRAVENOUS
  Filled 2018-10-14: qty 14

## 2018-10-14 MED ORDER — HEPARIN SOD (PORK) LOCK FLUSH 100 UNIT/ML IV SOLN
500.0000 [IU] | Freq: Once | INTRAVENOUS | Status: AC | PRN
  Administered 2018-10-14: 500 [IU]
  Filled 2018-10-14: qty 5

## 2018-10-14 MED ORDER — SODIUM CHLORIDE 0.9 % IV SOLN
Freq: Once | INTRAVENOUS | Status: AC
  Administered 2018-10-14: 10:00:00 via INTRAVENOUS

## 2018-10-14 NOTE — Assessment & Plan Note (Signed)
1.  Non-small cell lung cancer (Tx N3 M0), adenocarcinoma: - Patient noticed lymphadenopathy in the neck region in October 2019.  She also has night sweats but denies any fevers or significant weight loss. - Difficulty swallowing for the past few weeks.  Drinking Ensure 1 to 2 cans/day. - CT of the chest without contrast on 08/12/2018 shows bulky right hilar and mediastinal adenopathy with bilateral supraclavicular adenopathy, scattered small lung lesions nonspecific.  1.2 cm liver lesion incompletely visualized, may represent cyst, hemangioma. -Physical exam reveals bilateral supraclavicular adenopathy. - She had a biopsy of the left supra clavicular lymph node on 08/30/2018 which showed metastatic poorly differentiated adenocarcinoma consistent with lung primary.  Tumor was strongly positive for CK7 and TTF-1.  Weak positivity for Napsin-A and MOC-31. - She is not eating much.  She lost about 5 pounds in the last 2 weeks.  She eats mashed potatoes and cooked squash.  She is drinking 7-Up and cranberry juice.  She is not able to tolerate Ensure or boost because of the sweetness. - PET/CT scan on 08/23/2018 shows lymphadenopathy in the bilateral supraclavicular regions, mediastinum, subcarinal, right hilar more than left hilar with no pulmonary pathology.  No subdiaphragmatic disease seen.   -MRI of the brain without contrast on 08/24/2018 shows several small areas of nonspecific T2/flair hyperintensity in the cerebral white matter, some with mild T2 shine through on diffusion. -CT scan of the brain with and without contrast on 09/17/2018 shows no intracranial metastasis. -Palliative radiation therapy from 09/14/2018 through 09/28/2018.  She could not lay flat on the table. - Weekly carboplatin and paclitaxel started on 10/07/2018.  - Did not experience any chemotherapy related side effects.  She felt tired for couple of days. -She is able to lie flat on her back for few minutes each day. -We discussed  results of the blood work.  She will proceed with week 2 treatment today.  She will be reevaluated in 1 week.  2.  Weight loss: -She had PEG tube placed on 10/01/2018 and started tube feeds on 10/03/2018.   -She is taking in Osmolite 1.5, 5 ounces with 4 ounces of water per feed 4 times a day.  She is also taking and ProMod once a day. -She gained about 3 pounds since last visit.

## 2018-10-14 NOTE — Patient Instructions (Signed)
[facility] Discharge Instructions for Patients Receiving Chemotherapy  Today you received the following chemotherapy agents   To help prevent nausea and vomiting after your treatment, we encourage you to take your nausea medication   If you develop nausea and vomiting that is not controlled by your nausea medication, call the clinic.   BELOW ARE SYMPTOMS THAT SHOULD BE REPORTED IMMEDIATELY:  *FEVER GREATER THAN 100.5 F  *CHILLS WITH OR WITHOUT FEVER  NAUSEA AND VOMITING THAT IS NOT CONTROLLED WITH YOUR NAUSEA MEDICATION  *UNUSUAL SHORTNESS OF BREATH  *UNUSUAL BRUISING OR BLEEDING  TENDERNESS IN MOUTH AND THROAT WITH OR WITHOUT PRESENCE OF ULCERS  *URINARY PROBLEMS  *BOWEL PROBLEMS  UNUSUAL RASH Items with * indicate a potential emergency and should be followed up as soon as possible.  Feel free to call the clinic should you have any questions or concerns. The clinic phone number is [phone].  Please show the CHEMO ALERT CARD at check-in to the Emergency Department and triage nurse.   

## 2018-10-14 NOTE — Progress Notes (Signed)
Per RLockamy NP proceed with treatment, labs reviewed. Pt had appt. With Dr. Delton Coombes. Vital signs reviewed by MD.  Treatment given today per MD orders. Tolerated infusion without adverse affects. Vital signs stable. No complaints at this time. Discharged from clinic via wheel chair accompanied by husband. F/U with [facility] as scheduled.

## 2018-10-14 NOTE — Progress Notes (Signed)
Nutrition Follow up  ASSESSMENT:  63 y/o female. Active smoker w/ 40 pack yr hx. In Dec, developed supraclavicular lymphadenopathy and associated dysphagia. PCP ordered CT chest which showed adenopathy throughout thorax w/ f/u PET confirming hypermetabolic properties. Biopsy 12/30. + for metastatic adenocarcinoma. D/t inability to lie flat for radiotherapy, underwent only short 10-dose course of pallaitive radiation 1/14, w/ plans for full radiotherapy once she can lie flat. S/P peg placement 1/31. Chemo start 2/6  Following up with pt regarding her TF tolerance/progression. When RD last saw pt 2/6-she had been able to tolerate a volume of 11 oz/day. We had set of goal of her being able to infuse 5 oz TID (15 oz/d total) by today.   Today, she reports she met this goal and then some. She has been infusing 5 oz QID. She reportedly had thought the goal we discussed was TID, not QID. She reports compliance w/ the 60 ml water flush before/after feeds as well as the 30 oz of Promod. She does report experiencing uncomfortable amounts of bloating w/ the 20 oz of tube feeding and asks if this can be reduced back to the aforementioned goal of 15 oz/day. No other s/s of intolerance reported  In regards to her cancer treatment, she says she tolerated her first chemo reasonably well. She did not have any n/v/c/d. Her only significant side effect was fatigue. She said she slept for the entire day after, getting up only to infuse feeds. Per Rad Oncs note from yesterday, pt still unable to lie flat and will be reassessed in another 2 weeks for possible initiation of her full course of radiotherapy.   Orally, she still reports significant dysphagia, no better since she started chemotherapy a week ago. She still is largely limited to liquids, though she reports having small amounts of spaghettit and mashed potatoes this past week. Her liquid intake is almost exclusively 7up. She says this is what she best tolerates.    She reports significant constipation. She says she has not had a BM in about 1 week. She has not been taking anything for her bowels. Per their report, MD instructed them to try Dulcolax and a daily stool softener for prophylaxis.   In regards to other issues, she says the wound on her  "has almost healed", though it is still sore/tender. Her generalized pain, which she had reported as "severe" previously, is now well controlled with the liquid ibuprofen. She has not been using the hydrocodone  Gratefully, she exhibits further wt gain. She has gained weight for the second office visit in a row. She is up 5 lbs since initiation of TF 2 weeks ago and is now back above 80 lbs. She still is down 13.5 lbs since her initial cancer center visit 12/17 (was 94.5 lbs) and 17 lbs down from wt in November (was 98.2 lbs). Her degree of wt loss still meets malnutrition criteria. Prior to her cancer dx, her wt appears to have been stable at 98-102 lbs x5 years.   Other than the constipation, pt reports feeling "much better" since starting the tube feeding. "I feel much stronger". She says she has become more ambulatory. She is in good spirits.  Wt Readings from Last 10 Encounters:  10/14/18 81 lb (36.7 kg)  10/07/18 78 lb 6 oz (35.6 kg)  10/01/18 75 lb 13.4 oz (34.4 kg)  09/29/18 76 lb (34.5 kg)  09/23/18 82 lb (37.2 kg)  09/21/18 82 lb (37.2 kg)  09/17/18 87 lb 15.4  oz (39.9 kg)  09/07/18 88 lb (39.9 kg)  08/24/18 91 lb 9.6 oz (41.5 kg)  08/17/18 94 lb 6.4 oz (42.8 kg)   MEDICATIONS:  Chemotherapy: carboplatin and paclitaxel -Start 2/6  Supportive meds: Hydrocodone, compazine, Scopolamine, zofran, diflucan,   Albumin: 3.3 (stable x1 wk, 3.8 1/29)] Hgb: 10.2, glu 101   LABS:   Recent Labs  Lab 10/14/18 0851  NA 135  K 4.2  CL 99  CO2 27  BUN 12  CREATININE 0.30*  CALCIUM 8.7*  MG 2.2  GLUCOSE 101*     ANTHROPOMETRICS: Height:  Ht Readings from Last 1 Encounters:  10/01/18 '5\' 2"'$   (1.575 m)   Weight:  Wt Readings from Last 1 Encounters:  10/14/18 81 lb (36.7 kg)   BMI:  BMI Readings from Last 1 Encounters:  10/14/18 14.82 kg/m   UBW: Was 98-102 lbs x 5 years prior to December.  IBW: 50 Wt changes:  +5 lbs since initiation of TF 2 weeks ago  -13.5 lbs since initial [facility] visit 12/17 (was 94.5 lbs) -17 lbs down from wt in November (was 98.2 lbs).  ReESTIMATED ENERGY NEEDS:  Kcal: 1300-1450kcals (35-40 kcal/kg bw) Protein: >70g Pro (2 g/kg bw) Fluid: >1.3 L fluid (35 ml/kg bw)  NUTRITION DIAGNOSIS:  Severe malnutrition related to cancer and related dysphagia/postprandial vomiting as evidenced by loss of >10% bw x2 months and severe muscle/fat loss   Ongoing  DOCUMENTATION CODES:  Severe malnutrition (acute vs chronic), underweight  INTERVENTION:    Goal TF regimen: 1 can Osmolite 1.5 QID w/ 30 ml Promod. +60 ml flush before / after each bolus -Progressing to goal-  Current TF regimen: 4 oz Osmolite 1.5 QID w/ 60 ml flush before/after each bolus and Promod q 24 hrs w/ 30 ml flush before/after  Current TF provides: 810 kcals, 40g Pro, 362 ml free water (+540 from flushes for total of 902 mls h20)  Pt is doing far better than anticipated. Despite her only infusing half of original TF goal, she continues to gain wt, though this may be partially r/t not having BM in 1 week. She also reports healing of the wound on her bottom-an excellent sign she is meeting her kcal/pro needs. Her kcal/pro needs appear to be much lower than anticipated. Given her reported adverse affects with the 5 oz TID, will reduce to 4 oz TID for time being and continue the flushes and Promod. She likely will gradually be able to tolerate more volume as she goes.   While her constipation could be r/t inadequate fluids, she reports consuming liquids all throughout day. Her renal labs are also WDL-though she has little muscle mass.   Will continue to monitor wt closely and make adjustments  as needed. Advised them to follow MD instructions regarding management of constipation. She is in agreement with this plan. Will see x1 week. They have RD contact information if needed.   GOAL:  Oral + TF intake to meet >90% of needs, weight stability  Progressing, potentially close to be being met  MONITOR:  Weight, oral intake, level of dysphagia/diet tolerance, treatment plan, TF tolerance  Next Visit:  Thursday 2/20  Burtis Junes RD, LDN, CNSC Clinical Nutrition Available Tues-Sat via Pager: [phone] 10/14/2018 9:22 AM

## 2018-10-14 NOTE — Progress Notes (Signed)
Erika Turner, [place] [address]   CLINIC:  Medical Oncology/Hematology  PCP:  Sharion Balloon, [place] [address] [phone]   REASON FOR VISIT: Follow-up for adenocarcinoma of the lung  CURRENT THERAPY:chemoradiationCarboplatin,paclitaxel weekly  BRIEF ONCOLOGIC HISTORY:    Adenocarcinoma of lung ([place])   09/07/2018 Initial Diagnosis    Adenocarcinoma of lung ([place])    10/07/2018 -  Chemotherapy    The patient had palonosetron (ALOXI) injection 0.25 mg, 0.25 mg, Intravenous,  Once, 2 of 5 cycles Administration: 0.25 mg (10/07/2018) CARBOplatin (PARAPLATIN) 100 mg in sodium chloride 0.9 % 100 mL chemo infusion, 100 mg (100 % of original dose 100 mg), Intravenous,  Once, 2 of 5 cycles Dose modification: 100 mg (original dose 100 mg, Cycle 1, Reason: Provider Judgment),   (original dose 137.8 mg, Cycle 2) Administration: 100 mg (10/07/2018) PACLitaxel (TAXOL) 60 mg in sodium chloride 0.9 % 150 mL chemo infusion (</= 80mg /m2), 45 mg/m2 = 60 mg, Intravenous,  Once, 2 of 5 cycles Administration: 60 mg (10/07/2018)  for chemotherapy treatment.        INTERVAL HISTORY:  Erika Turner 63 y.o. female returns for routine follow-up for adenocarcinoma of the lung. She reports she has gained 3 pounds. She has increased her tube feedings. She is taking 4 feedings a day. She does complain of constipation. She has not had a bowel movement in 5 days. She was confused about how she was supposed to take the stool softener. We educated her about taking it daily. She still has thrust and will have completed her diflucan in a few days. We will reassess this next week. She wishes to talk to the Nutritionist about feeling so bloated and full after each feeding.  Denies any nausea, vomiting, or diarrhea. Denies any new pains. Had not noticed any recent bleeding such as epistaxis, hematuria or hematochezia. Denies recent chest pain on exertion,  shortness of breath on minimal exertion, pre-syncopal episodes, or palpitations. Denies any numbness or tingling in hands or feet. Denies any recent fevers, infections, or recent hospitalizations. Patient reports appetite at 25% and energy level at 50%.   REVIEW OF SYSTEMS:  Review of Systems  Constitutional: Positive for fatigue.  Gastrointestinal: Positive for constipation and nausea.  All other systems reviewed and are negative.    PAST MEDICAL/SURGICAL HISTORY:  Past Medical History:  Diagnosis Date  . GAD (generalized anxiety disorder) 01/21/2017  . Lymphadenopathy   . Underweight 01/21/2017   Past Surgical History:  Procedure Laterality Date  . ABDOMINAL HYSTERECTOMY    . CESAREAN SECTION     x 2  . ESOPHAGOGASTRODUODENOSCOPY (EGD) WITH PROPOFOL N/A 10/01/2018   Procedure: ESOPHAGOGASTRODUODENOSCOPY (EGD) WITH PROPOFOL;  Surgeon: Aviva Signs, MD;  Location: [facility];  Service: General;  Laterality: N/A;  . LYMPH NODE BIOPSY Left 08/30/2018   Procedure: CERVICAL LYMPH NODE BIOPSY;  Surgeon: Aviva Signs, MD;  Location: [facility];  Service: General;  Laterality: Left;  . PEG PLACEMENT N/A 10/01/2018   Procedure: PERCUTANEOUS ENDOSCOPIC GASTROSTOMY (PEG) PLACEMENT;  Surgeon: Aviva Signs, MD;  Location: [facility];  Service: General;  Laterality: N/A;  . PORTACATH PLACEMENT Right 09/17/2018   Procedure: INSERTION PORT-A-CATH (ATTACHED CATHETER IN RIGHT SUBCLAVIAN);  Surgeon: Aviva Signs, MD;  Location: [facility];  Service: General;  Laterality: Right;     SOCIAL HISTORY:  Social History   Socioeconomic History  . Marital status: Married    Spouse name: Not on file  .  Number of children: 2  . Years of education: Not on file  . Highest education level: Not on file  Occupational History  . Occupation: housekeeper  Social Needs  . Financial resource strain: Not hard at all  . Food insecurity:    Worry: Never true    Inability: Never true  . Transportation needs:    Medical: No      Non-medical: No  Tobacco Use  . Smoking status: Current Every Day Smoker    Packs/day: 1.00    Years: 40.00    Pack years: 40.00    Types: Cigarettes    Start date: 11/02/1978  . Smokeless tobacco: Never Used  Substance and Sexual Activity  . Alcohol use: No  . Drug use: No  . Sexual activity: Not on file  Lifestyle  . Physical activity:    Days per week: 0 days    Minutes per session: 0 min  . Stress: To some extent  Relationships  . Social connections:    Talks on phone: Twice a week    Gets together: Never    Attends religious service: Never    Active member of club or organization: No    Attends meetings of clubs or organizations: Never    Relationship status: Married  . Intimate partner violence:    Fear of current or ex partner: No    Emotionally abused: No    Physically abused: No    Forced sexual activity: No  Other Topics Concern  . Not on file  Social History Narrative   Housekeeping at [employer].    FAMILY HISTORY:  Family History  Problem Relation Age of Onset  . Diabetes Mother   . Heart disease Mother        Pacemaker   . Cancer Father        Lung  . Cancer Brother        bladder then colon  . Down syndrome Brother   . Mental illness Daughter     CURRENT MEDICATIONS:  Outpatient Encounter Medications as of 10/14/2018  Medication Sig  . CARBOPLATIN IV Inject into the vein once a week.  . escitalopram (LEXAPRO) 10 MG tablet Take 1 tablet (10 mg total) by mouth daily.  . Fluconazole (DIFLUCAN PO) Take by mouth.  Marland Kitchen ibuprofen (ADVIL,MOTRIN) 200 MG tablet Take 200-400 mg by mouth 2 (two) times daily as needed for moderate pain.  . Ipratropium-Albuterol (COMBIVENT) 20-100 MCG/ACT AERS respimat Inhale 1 puff into the lungs every 6 (six) hours.  . lidocaine-prilocaine (EMLA) cream Apply to affected area once  . PACLitaxel (TAXOL IV) Inject into the vein once a week.  Marland Kitchen scopolamine (TRANSDERM-SCOP) 1 MG/3DAYS Place 1 patch (1.5 mg total) onto the  skin every 3 (three) days.  Marland Kitchen Spacer/Aero-Holding Chambers (BREATHERITE COLL SPACER ADULT) MISC 1 Device by Does not apply route every 6 (six) hours.  . diphenhydramine-acetaminophen (TYLENOL PM) 25-500 MG TABS tablet Take 1 tablet by mouth at bedtime.   Marland Kitchen HYDROcodone-acetaminophen (NORCO) 5-325 MG tablet Take 1 tablet by mouth every 6 (six) hours as needed for moderate pain. (Patient not taking: Reported on 10/14/2018)  . ondansetron (ZOFRAN ODT) 4 MG disintegrating tablet Take 1 tablet (4 mg total) by mouth every 8 (eight) hours as needed for nausea or vomiting. (Patient not taking: Reported on 10/14/2018)  . prochlorperazine (COMPAZINE) 10 MG tablet Take 1 tablet (10 mg total) by mouth every 6 (six) hours as needed for nausea or vomiting. (Patient not taking: Reported  on 10/14/2018)  . [DISCONTINUED] Naphazoline-Glycerin (REDNESS RELIEF OP) Place 1 drop into both eyes daily.   No facility-administered encounter medications on file as of 10/14/2018.     ALLERGIES:  Allergies  Allergen Reactions  . Biaxin [Clarithromycin] Nausea And Vomiting     PHYSICAL EXAM:  ECOG Performance status: 1  Vitals:   10/14/18 0918  BP: (!) 79/54  Pulse: (!) 115  Resp: 18  Temp: 98.4 F (36.9 C)  SpO2: 97%   Filed Weights   10/14/18 0918  Weight: 81 lb (36.7 kg)    Physical Exam Constitutional:      Appearance: Normal appearance. She is normal weight.  Cardiovascular:     Rate and Rhythm: Normal rate and regular rhythm.     Heart sounds: Normal heart sounds.  Pulmonary:     Effort: Pulmonary effort is normal.     Breath sounds: Normal breath sounds.  Musculoskeletal: Normal range of motion.  Skin:    General: Skin is warm and dry.  Neurological:     Mental Status: She is alert and oriented to person, place, and time. Mental status is at baseline.  Psychiatric:        Mood and Affect: Mood normal.        Behavior: Behavior normal.        Thought Content: Thought content normal.         Judgment: Judgment normal.      LABORATORY DATA:  I have reviewed the labs as listed.  CBC    Component Value Date/Time   WBC 7.8 10/14/2018 0851   RBC 3.74 (L) 10/14/2018 0851   HGB 10.2 (L) 10/14/2018 0851   HGB 12.5 07/02/2018 1847   HCT 33.2 (L) 10/14/2018 0851   HCT 37.3 07/02/2018 1847   PLT 392 10/14/2018 0851   PLT 407 07/02/2018 1847   MCV 88.8 10/14/2018 0851   MCV 86 07/02/2018 1847   MCH 27.3 10/14/2018 0851   MCHC 30.7 10/14/2018 0851   RDW 14.6 10/14/2018 0851   RDW 12.4 07/02/2018 1847   LYMPHSABS 0.3 (L) 10/14/2018 0851   LYMPHSABS 2.1 07/02/2018 1847   MONOABS 0.7 10/14/2018 0851   EOSABS 0.5 10/14/2018 0851   EOSABS 0.1 07/02/2018 1847   BASOSABS 0.0 10/14/2018 0851   BASOSABS 0.1 07/02/2018 1847   CMP Latest Ref Rng & Units 10/14/2018 10/07/2018 09/29/2018  Glucose 70 - 99 mg/dL 101(H) 107(H) 127(H)  BUN 6 - 20 mg/dL 12 10 9   Creatinine 0.44 - 1.00 mg/dL 0.30(L) 0.39(L) 0.43(L)  Sodium 135 - 145 mmol/L 135 132(L) 133(L)  Potassium 3.5 - 5.1 mmol/L 4.2 3.9 3.8  Chloride 98 - 111 mmol/L 99 96(L) 92(L)  CO2 22 - 32 mmol/L 27 29 30   Calcium 8.9 - 10.3 mg/dL 8.7(L) 8.7(L) 9.3  Total Protein 6.5 - 8.1 g/dL 6.4(L) 6.4(L) 7.0  Total Bilirubin 0.3 - 1.2 mg/dL 0.3 0.2(L) 0.4  Alkaline Phos 38 - 126 U/L 67 71 81  AST 15 - 41 U/L 18 18 14(L)  ALT 0 - 44 U/L 12 15 10        DIAGNOSTIC IMAGING:  I have independently reviewed the scans and discussed with the patient.   I have reviewed Francene Finders, NP's note and agree with the documentation.  I personally performed a face-to-face visit, made revisions and my assessment and plan is as follows.    ASSESSMENT & PLAN:   Adenocarcinoma of lung ([place]) 1.  Non-small cell lung cancer (Tx N3 M0), adenocarcinoma: -  Patient noticed lymphadenopathy in the neck region in October 2019.  She also has night sweats but denies any fevers or significant weight loss. - Difficulty swallowing for the past few weeks.  Drinking  Ensure 1 to 2 cans/day. - CT of the chest without contrast on 08/12/2018 shows bulky right hilar and mediastinal adenopathy with bilateral supraclavicular adenopathy, scattered small lung lesions nonspecific.  1.2 cm liver lesion incompletely visualized, may represent cyst, hemangioma. -Physical exam reveals bilateral supraclavicular adenopathy. - She had a biopsy of the left supra clavicular lymph node on 08/30/2018 which showed metastatic poorly differentiated adenocarcinoma consistent with lung primary.  Tumor was strongly positive for CK7 and TTF-1.  Weak positivity for Napsin-A and MOC-31. - She is not eating much.  She lost about 5 pounds in the last 2 weeks.  She eats mashed potatoes and cooked squash.  She is drinking 7-Up and cranberry juice.  She is not able to tolerate Ensure or boost because of the sweetness. - PET/CT scan on 08/23/2018 shows lymphadenopathy in the bilateral supraclavicular regions, mediastinum, subcarinal, right hilar more than left hilar with no pulmonary pathology.  No subdiaphragmatic disease seen.   -MRI of the brain without contrast on 08/24/2018 shows several small areas of nonspecific T2/flair hyperintensity in the cerebral white matter, some with mild T2 shine through on diffusion. -CT scan of the brain with and without contrast on 09/17/2018 shows no intracranial metastasis. -Palliative radiation therapy from 09/14/2018 through 09/28/2018.  She could not lay flat on the table. - Weekly carboplatin and paclitaxel started on 10/07/2018.  - Did not experience any chemotherapy related side effects.  She felt tired for couple of days. -She is able to lie flat on her back for few minutes each day. -We discussed results of the blood work.  She will proceed with week 2 treatment today.  She will be reevaluated in 1 week.  2.  Weight loss: -She had PEG tube placed on 10/01/2018 and started tube feeds on 10/03/2018.   -She is taking in Osmolite 1.5, 5 ounces with 4 ounces of  water per feed 4 times a day.  She is also taking and ProMod once a day. -She gained about 3 pounds since last visit.      Orders placed this encounter:  No orders of the defined types were placed in this encounter.     Derek Jack, MD [place] [phone]

## 2018-10-14 NOTE — Patient Instructions (Signed)
[facility] at [facility] Discharge Instructions     Thank you for choosing  Cancer Center at [facility] to provide your oncology and hematology care.  To afford each patient quality time with our provider, please arrive at least 15 minutes before your scheduled appointment time.   If you have a lab appointment with the Cancer Center please come in thru the  Main Entrance and check in at the main information desk  You need to re-schedule your appointment should you arrive 10 or more minutes late.  We strive to give you quality time with our providers, and arriving late affects you and other patients whose appointments are after yours.  Also, if you no show three or more times for appointments you may be dismissed from the clinic at the providers discretion.     Again, thank you for choosing [facility].  Our hope is that these requests will decrease the amount of time that you wait before being seen by our physicians.       _____________________________________________________________  Should you have questions after your visit to [facility], please contact our office at [phone] between the hours of 8:00 a.m. and 4:30 p.m.  Voicemails left after 4:00 p.m. will not be returned until the following business day.  For prescription refill requests, have your pharmacy contact our office and allow 72 hours.    Cancer Center Support Programs:   > Cancer Support Group  2nd Tuesday of the month 1pm-2pm, Journey Room    

## 2018-10-20 ENCOUNTER — Ambulatory Visit: Payer: Managed Care, Other (non HMO) | Admitting: Nurse Practitioner

## 2018-10-21 ENCOUNTER — Other Ambulatory Visit: Payer: Self-pay

## 2018-10-21 ENCOUNTER — Encounter (HOSPITAL_COMMUNITY): Payer: Self-pay | Admitting: Hematology

## 2018-10-21 ENCOUNTER — Encounter (HOSPITAL_COMMUNITY): Payer: Self-pay | Admitting: Dietician

## 2018-10-21 ENCOUNTER — Inpatient Hospital Stay (HOSPITAL_COMMUNITY): Payer: Managed Care, Other (non HMO)

## 2018-10-21 ENCOUNTER — Inpatient Hospital Stay (HOSPITAL_BASED_OUTPATIENT_CLINIC_OR_DEPARTMENT_OTHER): Payer: Managed Care, Other (non HMO) | Admitting: Hematology

## 2018-10-21 VITALS — BP 111/59 | HR 94 | Temp 98.0°F | Resp 18

## 2018-10-21 DIAGNOSIS — B3781 Candidal esophagitis: Secondary | ICD-10-CM

## 2018-10-21 DIAGNOSIS — F17210 Nicotine dependence, cigarettes, uncomplicated: Secondary | ICD-10-CM

## 2018-10-21 DIAGNOSIS — R1310 Dysphagia, unspecified: Secondary | ICD-10-CM

## 2018-10-21 DIAGNOSIS — Z79899 Other long term (current) drug therapy: Secondary | ICD-10-CM

## 2018-10-21 DIAGNOSIS — Z931 Gastrostomy status: Secondary | ICD-10-CM

## 2018-10-21 DIAGNOSIS — K5900 Constipation, unspecified: Secondary | ICD-10-CM

## 2018-10-21 DIAGNOSIS — R140 Abdominal distension (gaseous): Secondary | ICD-10-CM

## 2018-10-21 DIAGNOSIS — R530 Neoplastic (malignant) related fatigue: Secondary | ICD-10-CM | POA: Diagnosis not present

## 2018-10-21 DIAGNOSIS — R531 Weakness: Secondary | ICD-10-CM

## 2018-10-21 DIAGNOSIS — C3490 Malignant neoplasm of unspecified part of unspecified bronchus or lung: Secondary | ICD-10-CM

## 2018-10-21 DIAGNOSIS — B370 Candidal stomatitis: Secondary | ICD-10-CM

## 2018-10-21 DIAGNOSIS — Z5111 Encounter for antineoplastic chemotherapy: Secondary | ICD-10-CM | POA: Diagnosis not present

## 2018-10-21 DIAGNOSIS — Z791 Long term (current) use of non-steroidal anti-inflammatories (NSAID): Secondary | ICD-10-CM

## 2018-10-21 DIAGNOSIS — R634 Abnormal weight loss: Secondary | ICD-10-CM

## 2018-10-21 LAB — COMPREHENSIVE METABOLIC PANEL
ALT: 11 U/L (ref 0–44)
AST: 17 U/L (ref 15–41)
Albumin: 3.4 g/dL — ABNORMAL LOW (ref 3.5–5.0)
Alkaline Phosphatase: 63 U/L (ref 38–126)
Anion gap: 9 (ref 5–15)
BUN: 12 mg/dL (ref 6–20)
CO2: 29 mmol/L (ref 22–32)
Calcium: 8.8 mg/dL — ABNORMAL LOW (ref 8.9–10.3)
Chloride: 97 mmol/L — ABNORMAL LOW (ref 98–111)
Creatinine, Ser: 0.32 mg/dL — ABNORMAL LOW (ref 0.44–1.00)
GFR calc non Af Amer: >60 mL/min (ref >60)
Glucose, Bld: 115 mg/dL — ABNORMAL HIGH (ref 70–99)
Potassium: 3.6 mmol/L (ref 3.5–5.1)
Sodium: 135 mmol/L (ref 135–145)
Total Bilirubin: 0.2 mg/dL — ABNORMAL LOW (ref 0.3–1.2)
Total Protein: 6.5 g/dL (ref 6.5–8.1)

## 2018-10-21 LAB — CBC WITH DIFFERENTIAL/PLATELET
Abs Immature Granulocytes: 0.02 10*3/uL (ref 0.00–0.07)
Basophils Absolute: 0 10*3/uL (ref 0.0–0.1)
Basophils Relative: 0 %
EOS ABS: 0.2 10*3/uL (ref 0.0–0.5)
EOS PCT: 3 %
HEMATOCRIT: 32.7 % — AB (ref 36.0–46.0)
Hemoglobin: 10 g/dL — ABNORMAL LOW (ref 12.0–15.0)
Immature Granulocytes: 0 %
Lymphocytes Relative: 4 %
Lymphs Abs: 0.3 10*3/uL — ABNORMAL LOW (ref 0.7–4.0)
MCH: 27.2 pg (ref 26.0–34.0)
MCHC: 30.6 g/dL (ref 30.0–36.0)
MCV: 89.1 fL (ref 80.0–100.0)
Monocytes Absolute: 0.7 10*3/uL (ref 0.1–1.0)
Monocytes Relative: 11 %
Neutro Abs: 5.4 10*3/uL (ref 1.7–7.7)
Neutrophils Relative %: 82 %
Platelets: 355 10*3/uL (ref 150–400)
RBC: 3.67 MIL/uL — ABNORMAL LOW (ref 3.87–5.11)
RDW: 15.5 % (ref 11.5–15.5)
WBC: 6.6 10*3/uL (ref 4.0–10.5)
nRBC: 0 % (ref 0.0–0.2)

## 2018-10-21 LAB — LACTATE DEHYDROGENASE: LDH: 160 U/L (ref 98–192)

## 2018-10-21 LAB — MAGNESIUM: Magnesium: 2.2 mg/dL (ref 1.7–2.4)

## 2018-10-21 LAB — PHOSPHORUS: Phosphorus: 3.3 mg/dL (ref 2.5–4.6)

## 2018-10-21 MED ORDER — SODIUM CHLORIDE 0.9 % IV SOLN
20.0000 mg | Freq: Once | INTRAVENOUS | Status: AC
  Administered 2018-10-21: 20 mg via INTRAVENOUS
  Filled 2018-10-21: qty 2

## 2018-10-21 MED ORDER — FAMOTIDINE IN NACL 20-0.9 MG/50ML-% IV SOLN
INTRAVENOUS | Status: AC
  Filled 2018-10-21: qty 50

## 2018-10-21 MED ORDER — SODIUM CHLORIDE 0.9 % IV SOLN
137.8000 mg | Freq: Once | INTRAVENOUS | Status: AC
  Administered 2018-10-21: 140 mg via INTRAVENOUS
  Filled 2018-10-21: qty 14

## 2018-10-21 MED ORDER — SODIUM CHLORIDE 0.9 % IV SOLN: 20.0000 mg | Freq: Once | INTRAVENOUS | Status: DC

## 2018-10-21 MED ORDER — DIPHENHYDRAMINE HCL 50 MG/ML IJ SOLN
50.0000 mg | Freq: Once | INTRAMUSCULAR | Status: AC
  Administered 2018-10-21: 50 mg via INTRAVENOUS

## 2018-10-21 MED ORDER — FLUCONAZOLE 100 MG PO TABS: 100.0000 mg | ORAL_TABLET | Freq: Every day | ORAL | 0 refills | Status: DC

## 2018-10-21 MED ORDER — SODIUM CHLORIDE 0.9 % IV SOLN
45.0000 mg/m2 | Freq: Once | INTRAVENOUS | Status: AC
  Administered 2018-10-21: 60 mg via INTRAVENOUS
  Filled 2018-10-21: qty 10

## 2018-10-21 MED ORDER — SODIUM CHLORIDE 0.9% FLUSH
10.0000 mL | INTRAVENOUS | Status: DC | PRN
  Administered 2018-10-21: 10 mL
  Filled 2018-10-21: qty 10

## 2018-10-21 MED ORDER — FAMOTIDINE IN NACL 20-0.9 MG/50ML-% IV SOLN
20.0000 mg | Freq: Once | INTRAVENOUS | Status: AC
  Administered 2018-10-21: 20 mg via INTRAVENOUS

## 2018-10-21 MED ORDER — SODIUM CHLORIDE 0.9 % IV SOLN
Freq: Once | INTRAVENOUS | Status: AC
  Administered 2018-10-21: 10:00:00 via INTRAVENOUS

## 2018-10-21 MED ORDER — PALONOSETRON HCL INJECTION 0.25 MG/5ML
INTRAVENOUS | Status: AC
  Filled 2018-10-21: qty 5

## 2018-10-21 MED ORDER — PALONOSETRON HCL INJECTION 0.25 MG/5ML
0.2500 mg | Freq: Once | INTRAVENOUS | Status: AC
  Administered 2018-10-21: 0.25 mg via INTRAVENOUS

## 2018-10-21 MED ORDER — DIPHENHYDRAMINE HCL 50 MG/ML IJ SOLN
INTRAMUSCULAR | Status: AC
  Filled 2018-10-21: qty 1

## 2018-10-21 MED ORDER — HEPARIN SOD (PORK) LOCK FLUSH 100 UNIT/ML IV SOLN
500.0000 [IU] | Freq: Once | INTRAVENOUS | Status: AC | PRN
  Administered 2018-10-21: 500 [IU]

## 2018-10-21 NOTE — Patient Instructions (Signed)
[facility] Discharge Instructions for Patients Receiving Chemotherapy  Today you received the following chemotherapy agents  If you develop nausea and vomiting that is not controlled by your nausea medication, call the clinic.   BELOW ARE SYMPTOMS THAT SHOULD BE REPORTED IMMEDIATELY:  *FEVER GREATER THAN 100.5 F  *CHILLS WITH OR WITHOUT FEVER  NAUSEA AND VOMITING THAT IS NOT CONTROLLED WITH YOUR NAUSEA MEDICATION  *UNUSUAL SHORTNESS OF BREATH  *UNUSUAL BRUISING OR BLEEDING  TENDERNESS IN MOUTH AND THROAT WITH OR WITHOUT PRESENCE OF ULCERS  *URINARY PROBLEMS  *BOWEL PROBLEMS  UNUSUAL RASH Items with * indicate a potential emergency and should be followed up as soon as possible.  Feel free to call the clinic should you have any questions or concerns. The clinic phone number is [phone].  Please show the CHEMO ALERT CARD at check-in to the Emergency Department and triage nurse.   

## 2018-10-21 NOTE — Patient Instructions (Signed)
[place] at [facility] Discharge Instructions  Start taking metamucil or any brand of fiber over the counter.    Thank you for choosing [place] at [facility] to provide your oncology and hematology care.  To afford each patient quality time with our provider, please arrive at least 15 minutes before your scheduled appointment time.   If you have a lab appointment with the [place] please come in thru the  Main Entrance and check in at the main information desk  You need to re-schedule your appointment should you arrive 10 or more minutes late.  We strive to give you quality time with our providers, and arriving late affects you and other patients whose appointments are after yours.  Also, if you no show three or more times for appointments you may be dismissed from the clinic at the providers discretion.     Again, thank you for choosing [facility].  Our hope is that these requests will decrease the amount of time that you wait before being seen by our physicians.       _____________________________________________________________  Should you have questions after your visit to [facility], please contact our office at [phone] between the hours of 8:00 a.m. and 4:30 p.m.  Voicemails left after 4:00 p.m. will not be returned until the following business day.  For prescription refill requests, have your pharmacy contact our office and allow 72 hours.    Cancer Center Support Programs:   > Cancer Support Group  2nd Tuesday of the month 1pm-2pm, Journey Room

## 2018-10-21 NOTE — Progress Notes (Signed)
Nutrition Follow up  ASSESSMENT:  63 y/o female. Active smoker w/ 40 pack yr hx. In Dec, developed supraclavicular lymphadenopathy and associated dysphagia. PCP ordered CT chest which showed adenopathy throughout thorax w/ f/u PET confirming hypermetabolic properties. Biopsy 12/30. + for metastatic adenocarcinoma. D/t inability to lie flat for radiotherapy, underwent only short 10-dose course of pallaitive radiation 1/14, w/ plans for full radiotherapy once she can lie flat. S/P peg placement 1/31. Chemo start 2/6  Following up with pt regarding her TF tolerance/progression. Today is her 3rd chemo infusion. She says she tolerated her second infusion just as well as she tolerated the first- she slept for a day (getting up only for feeds) and now is back at baseline.   Regarding her TF. She reports compliance with the 4 oz of Osmolite 1.5 QID + 30 ml prostat Q24 hrs and 60 ml flushes before/after boluses.   Orally, she says her intake has not changed. SHe still almost exclusively consumes liquids d/t dysphagia. The small amounts of soft solids she consumes is insignificant.   Tolerance wise, she denies any n/v/d. She still has constipation. She had been told last week by MD to take a daily stool softener for prophylaxis and dulcolax prn. She says she started with a single pill of the dulcolax and it had no effect. She gradually increased the dose and as of yesterday was ultimately successfully, having a very large BM. Regarding her pain, she has this under control w/ liquid ibuprofen. She takes "1 cup-full" TID. She does not know exact volume she takes, but notes the strength is 5 ml = 500 mg.    Her labs are WDL. Renal labs/electrolytes stable.    Her weight is essentially unchanged from week prior. She still is down 14 lbs since her initial cancer center visit 12/17 (was 94.5 lbs) and 17.5 lbs down from wt in November (was 98.2 lbs). Her degree of wt loss still meets malnutrition criteria. Prior to  her cancer dx, her wt appears to have been stable at 98-102 lbs x5 years.   Wt Readings from Last 10 Encounters:  10/21/18 80 lb 4 oz (36.4 kg)  10/14/18 81 lb (36.7 kg)  10/07/18 78 lb 6 oz (35.6 kg)  10/01/18 75 lb 13.4 oz (34.4 kg)  09/29/18 76 lb (34.5 kg)  09/23/18 82 lb (37.2 kg)  09/21/18 82 lb (37.2 kg)  09/17/18 87 lb 15.4 oz (39.9 kg)  09/07/18 88 lb (39.9 kg)  08/24/18 91 lb 9.6 oz (41.5 kg)   MEDICATIONS:  Chemotherapy: carboplatin and paclitaxel -Start 2/6  Supportive meds: Hydrocodone, compazine, Scopolamine, zofran, diflucan, liquid ibuprofen (children's)  Albumin: 3.4  Renal function/electrolytes stable   LABS:   Recent Labs  Lab 10/21/18 0800  NA 135  K 3.6  CL 97*  CO2 29  BUN 12  CREATININE 0.32*  CALCIUM 8.8*  MG 2.2  PHOS 3.3  GLUCOSE 115*     ANTHROPOMETRICS: Height:  Ht Readings from Last 1 Encounters:  10/01/18 5\' 2"  (1.575 m)   Weight:  Wt Readings from Last 1 Encounters:  10/21/18 80 lb 4 oz (36.4 kg)   BMI:  BMI Readings from Last 1 Encounters:  10/21/18 14.68 kg/m   UBW: Was 98-102 lbs x 5 years prior to December.  IBW: 50 Wt changes:  +4.5 lbs since initiation of TF 3 weeks ago  -14 lbs since initial [facility] visit 12/17 (was 94.5 lbs) -18 lbs down from wt in November (was 98.2 lbs).  ReESTIMATED ENERGY NEEDS:  Kcal: 1300-1450 kcals (35-40 kcal/kg bw) Protein: >70g Pro (2 g/kg bw) Fluid: >1.3 L fluid (35 ml/kg bw)  NUTRITION DIAGNOSIS:  Severe malnutrition related to cancer and related dysphagia/postprandial vomiting as evidenced by loss of >10% bw x2 months and severe muscle/fat loss   Ongoing  DOCUMENTATION CODES:  Severe malnutrition (acute vs chronic), underweight  INTERVENTION:    Goal TF regimen: 1 can Osmolite 1.5 QID w/ 30 ml Promod. +60 ml flush before / after each bolus -Progressing to goal-  Current TF regimen: 4 oz Osmolite 1.5 QID w/ 60 ml flush before/after each bolus and Promod q 24 hrs w/ 30 ml flush  before/after  Current TF provides: 810 kcals, 40g Pro, 362 ml free water (+540 from flushes for total of 902 mls h20)  Thought she is tolerating her current regimen well, RD would like to see her increase volume. Explained she is still quite underweight and further gain is very much desired. She was hesitant to agree to this given how she felt the last time she did 5 oz boluses. RD put forth other ways to increase volume, but ultimately just prefers retrying the 5 oz QID. She will try this the coming week.   Regarding her constipation. MD had suggested metamucil. While this is certainly an option, RD voiced caution with this to pt. Noted how she has been on a nearly fiber free diet now for several months. Starting a high fiber supplement is liable to cause severe GI distress if she starts with the recommend dose (especially given her small size). Recommended only starting with a single tsp if she goes this route and increasing as toelrates. RD advised increasing fiber w/o drinking adequate fluids would make her MORE constipated. Also, fiber will leave her full and could lower her overall kcal intake. RD listed other options, including having a hot beverage in the morning to stimulate bowels, prune juice, increased activity and increased flushes. Gave patient handout titled "Constipation".    Will continue to monitor wt closely and make adjustments as needed. Will see x1 week. They have RD contact information if needed.   GOAL:  Oral + TF intake to meet >90% of needs, weight stability   -Goal for next week: Increase to 5 oz QID  MONITOR:  Weight, oral intake, level of dysphagia/diet tolerance, treatment plan, TF tolerance  Next Visit:  Thursday 2/27  Burtis Junes RD, LDN, CNSC Clinical Nutrition Available Tues-Sat via Pager: [phone] 10/21/2018 12:31 PM

## 2018-10-21 NOTE — Progress Notes (Signed)
Patient tolerated chemotherapy with no complaints voiced.  Port site clean and dry with no bruising or swelling noted at site.  Good blood return noted before and after administration of chemotherapy.  Band aid applied.  Patient left ambulatory with VSS and no s/s of distress noted.

## 2018-10-21 NOTE — Progress Notes (Signed)
Erika Turner, [place] [id number]   CLINIC:  Medical Oncology/Hematology  PCP:  Sharion Balloon, [place] [address] [phone]   REASON FOR VISIT: Follow-up for adenocarcinoma of the lung  CURRENT THERAPY:chemoradiationCarboplatin,paclitaxel weekly  BRIEF ONCOLOGIC HISTORY:    Adenocarcinoma of lung ([place])   09/07/2018 Initial Diagnosis    Adenocarcinoma of lung ([place])    10/07/2018 -  Chemotherapy    The patient had palonosetron (ALOXI) injection 0.25 mg, 0.25 mg, Intravenous,  Once, 4 of 6 cycles Administration: 0.25 mg (10/07/2018), 0.25 mg (10/14/2018), 0.25 mg (10/21/2018) CARBOplatin (PARAPLATIN) 100 mg in sodium chloride 0.9 % 100 mL chemo infusion, 100 mg (100 % of original dose 100 mg), Intravenous,  Once, 4 of 6 cycles Dose modification: 100 mg (original dose 100 mg, Cycle 1, Reason: Provider Judgment),   (original dose 137.8 mg, Cycle 2),   (original dose 137.8 mg, Cycle 3) Administration: 100 mg (10/07/2018), 140 mg (10/14/2018), 140 mg (10/21/2018) PACLitaxel (TAXOL) 60 mg in sodium chloride 0.9 % 150 mL chemo infusion (</= 80mg /m2), 45 mg/m2 = 60 mg, Intravenous,  Once, 4 of 6 cycles Administration: 60 mg (10/07/2018), 60 mg (10/14/2018), 60 mg (10/21/2018)  for chemotherapy treatment.       INTERVAL HISTORY:  Erika Turner 63 y.o. female returns for routine follow-up for adenocarcinoma of the lung. She tolerated her last treatment well. She report no increase in tiredness or nausea. She is doing her tube feedings 4oz 4 times a day with protein once a day. She is maintaining her weight at this time. She has had problems with constipation and will start taking fiber daily. Denies any nausea, vomiting, or diarrhea. Denies any new pains. Had not noticed any recent bleeding such as epistaxis, hematuria or hematochezia. Denies recent chest pain on exertion, shortness of breath on minimal exertion, pre-syncopal episodes, or  palpitations. Denies any numbness or tingling in hands or feet. Denies any recent fevers, infections, or recent hospitalizations. Patient reports appetite at 25% and energy level at 50%.   REVIEW OF SYSTEMS:  Review of Systems  Constitutional: Positive for fatigue.  Gastrointestinal: Positive for constipation and nausea.  All other systems reviewed and are negative.    PAST MEDICAL/SURGICAL HISTORY:  Past Medical History:  Diagnosis Date  . GAD (generalized anxiety disorder) 01/21/2017  . Lymphadenopathy   . Underweight 01/21/2017   Past Surgical History:  Procedure Laterality Date  . ABDOMINAL HYSTERECTOMY    . CESAREAN SECTION     x 2  . ESOPHAGOGASTRODUODENOSCOPY (EGD) WITH PROPOFOL N/A 10/01/2018   Procedure: ESOPHAGOGASTRODUODENOSCOPY (EGD) WITH PROPOFOL;  Surgeon: Aviva Signs, MD;  Location: [facility];  Service: General;  Laterality: N/A;  . LYMPH NODE BIOPSY Left 08/30/2018   Procedure: CERVICAL LYMPH NODE BIOPSY;  Surgeon: Aviva Signs, MD;  Location: [facility];  Service: General;  Laterality: Left;  . PEG PLACEMENT N/A 10/01/2018   Procedure: PERCUTANEOUS ENDOSCOPIC GASTROSTOMY (PEG) PLACEMENT;  Surgeon: Aviva Signs, MD;  Location: [facility];  Service: General;  Laterality: N/A;  . PORTACATH PLACEMENT Right 09/17/2018   Procedure: INSERTION PORT-A-CATH (ATTACHED CATHETER IN RIGHT SUBCLAVIAN);  Surgeon: Aviva Signs, MD;  Location: [facility];  Service: General;  Laterality: Right;     SOCIAL HISTORY:  Social History   Socioeconomic History  . Marital status: Married    Spouse name: Not on file  . Number of children: 2  . Years of education: Not on file  . Highest  education level: Not on file  Occupational History  . Occupation: housekeeper  Social Needs  . Financial resource strain: Not hard at all  . Food insecurity:    Worry: Never true    Inability: Never true  . Transportation needs:    Medical: No    Non-medical: No  Tobacco Use  . Smoking status: Current Every  Day Smoker    Packs/day: 1.00    Years: 40.00    Pack years: 40.00    Types: Cigarettes    Start date: 11/02/1978  . Smokeless tobacco: Never Used  Substance and Sexual Activity  . Alcohol use: No  . Drug use: No  . Sexual activity: Not on file  Lifestyle  . Physical activity:    Days per week: 0 days    Minutes per session: 0 min  . Stress: To some extent  Relationships  . Social connections:    Talks on phone: Twice a week    Gets together: Never    Attends religious service: Never    Active member of club or organization: No    Attends meetings of clubs or organizations: Never    Relationship status: Married  . Intimate partner violence:    Fear of current or ex partner: No    Emotionally abused: No    Physically abused: No    Forced sexual activity: No  Other Topics Concern  . Not on file  Social History Narrative   Housekeeping at [employer].    FAMILY HISTORY:  Family History  Problem Relation Age of Onset  . Diabetes Mother   . Heart disease Mother        Pacemaker   . Cancer Father        Lung  . Cancer Brother        bladder then colon  . Down syndrome Brother   . Mental illness Daughter     CURRENT MEDICATIONS:  Outpatient Encounter Medications as of 10/21/2018  Medication Sig  . CARBOPLATIN IV Inject into the vein once a week.  . diphenhydramine-acetaminophen (TYLENOL PM) 25-500 MG TABS tablet Take 1 tablet by mouth at bedtime.   Marland Kitchen escitalopram (LEXAPRO) 10 MG tablet Take 1 tablet (10 mg total) by mouth daily.  . Fluconazole (DIFLUCAN PO) Take by mouth.  Marland Kitchen HYDROcodone-acetaminophen (NORCO) 5-325 MG tablet Take 1 tablet by mouth every 6 (six) hours as needed for moderate pain.  Marland Kitchen ibuprofen (ADVIL,MOTRIN) 200 MG tablet Take 200-400 mg by mouth 2 (two) times daily as needed for moderate pain.  . Ipratropium-Albuterol (COMBIVENT) 20-100 MCG/ACT AERS respimat Inhale 1 puff into the lungs every 6 (six) hours.  . lidocaine-prilocaine (EMLA) cream Apply  to affected area once  . ondansetron (ZOFRAN ODT) 4 MG disintegrating tablet Take 1 tablet (4 mg total) by mouth every 8 (eight) hours as needed for nausea or vomiting.  Marland Kitchen PACLitaxel (TAXOL IV) Inject into the vein once a week.  . prochlorperazine (COMPAZINE) 10 MG tablet Take 1 tablet (10 mg total) by mouth every 6 (six) hours as needed for nausea or vomiting.  Marland Kitchen scopolamine (TRANSDERM-SCOP) 1 MG/3DAYS Place 1 patch (1.5 mg total) onto the skin every 3 (three) days.  Marland Kitchen Spacer/Aero-Holding Chambers (BREATHERITE COLL SPACER ADULT) MISC 1 Device by Does not apply route every 6 (six) hours.   No facility-administered encounter medications on file as of 10/21/2018.     ALLERGIES:  Allergies  Allergen Reactions  . Biaxin [Clarithromycin] Nausea And Vomiting  PHYSICAL EXAM:  ECOG Performance status: 1  Vitals:   10/21/18 0900  BP: 93/60  Pulse: (!) 114  Resp: 18  Temp: 98.3 F (36.8 C)  SpO2: 99%   Filed Weights   10/21/18 0900  Weight: 80 lb 4 oz (36.4 kg)    Physical Exam Constitutional:      Appearance: Normal appearance. She is normal weight.  Cardiovascular:     Rate and Rhythm: Normal rate and regular rhythm.     Heart sounds: Normal heart sounds.  Pulmonary:     Effort: Pulmonary effort is normal.     Breath sounds: Normal breath sounds.  Musculoskeletal: Normal range of motion.  Skin:    General: Skin is warm and dry.  Neurological:     Mental Status: She is alert and oriented to person, place, and time. Mental status is at baseline.  Psychiatric:        Mood and Affect: Mood normal.        Behavior: Behavior normal.        Thought Content: Thought content normal.        Judgment: Judgment normal.      LABORATORY DATA:  I have reviewed the labs as listed.  CBC    Component Value Date/Time   WBC 6.6 10/21/2018 0800   RBC 3.67 (L) 10/21/2018 0800   HGB 10.0 (L) 10/21/2018 0800   HGB 12.5 07/02/2018 1847   HCT 32.7 (L) 10/21/2018 0800   HCT 37.3  07/02/2018 1847   PLT 355 10/21/2018 0800   PLT 407 07/02/2018 1847   MCV 89.1 10/21/2018 0800   MCV 86 07/02/2018 1847   MCH 27.2 10/21/2018 0800   MCHC 30.6 10/21/2018 0800   RDW 15.5 10/21/2018 0800   RDW 12.4 07/02/2018 1847   LYMPHSABS 0.3 (L) 10/21/2018 0800   LYMPHSABS 2.1 07/02/2018 1847   MONOABS 0.7 10/21/2018 0800   EOSABS 0.2 10/21/2018 0800   EOSABS 0.1 07/02/2018 1847   BASOSABS 0.0 10/21/2018 0800   BASOSABS 0.1 07/02/2018 1847   CMP Latest Ref Rng & Units 10/21/2018 10/14/2018 10/07/2018  Glucose 70 - 99 mg/dL 115(H) 101(H) 107(H)  BUN 6 - 20 mg/dL 12 12 10   Creatinine 0.44 - 1.00 mg/dL 0.32(L) 0.30(L) 0.39(L)  Sodium 135 - 145 mmol/L 135 135 132(L)  Potassium 3.5 - 5.1 mmol/L 3.6 4.2 3.9  Chloride 98 - 111 mmol/L 97(L) 99 96(L)  CO2 22 - 32 mmol/L 29 27 29   Calcium 8.9 - 10.3 mg/dL 8.8(L) 8.7(L) 8.7(L)  Total Protein 6.5 - 8.1 g/dL 6.5 6.4(L) 6.4(L)  Total Bilirubin 0.3 - 1.2 mg/dL 0.2(L) 0.3 0.2(L)  Alkaline Phos 38 - 126 U/L 63 67 71  AST 15 - 41 U/L 17 18 18   ALT 0 - 44 U/L 11 12 15        DIAGNOSTIC IMAGING:  I have independently reviewed the scans and discussed with the patient.   I have reviewed Francene Finders, NP's note and agree with the documentation.  I personally performed a face-to-face visit, made revisions and my assessment and plan is as follows.    ASSESSMENT & PLAN:   Adenocarcinoma of lung ([place]) 1.  Non-small cell lung cancer (Tx N3 M0), adenocarcinoma: - Patient noticed lymphadenopathy in the neck region in October 2019.  She also has night sweats but denies any fevers or significant weight loss. - CT of the chest without contrast on 08/12/2018 shows bulky right hilar and mediastinal adenopathy with bilateral supraclavicular adenopathy, scattered small  lung lesions nonspecific.  1.2 cm liver lesion incompletely visualized, may represent cyst, hemangioma. - She had a biopsy of the left supra clavicular lymph node on 08/30/2018 which  showed metastatic poorly differentiated adenocarcinoma consistent with lung primary.  Tumor was strongly positive for CK7 and TTF-1.  Weak positivity for Napsin-A and MOC-31. - PET/CT scan on 08/23/2018 shows lymphadenopathy in the bilateral supraclavicular regions, mediastinum, subcarinal, right hilar more than left hilar with no pulmonary pathology.  No subdiaphragmatic disease seen.   -MRI of the brain without contrast on 08/24/2018 shows several small areas of nonspecific T2/flair hyperintensity in the cerebral white matter, some with mild T2 shine through on diffusion. -CT scan of the brain with and without contrast on 09/17/2018 shows no intracranial metastasis. -Palliative radiation therapy from 09/14/2018 through 09/28/2018.  She could not lay flat on the table. - Weekly carboplatin and paclitaxel started on 10/07/2018. -She received week 2 of treatment on 10/14/2018.  She felt tired for couple of days. -Denies any neuropathy related side effects.  Denies any nausea or vomiting. -She does have constipation.  I have encouraged her to take fiber supplements.  She will use Dulcolax as needed. -Today we have reviewed her blood work which is stable.  She may proceed with week 3 carboplatin and paclitaxel.  She will come back next week for her week 4 of treatment.  2.  Weight loss: -She has PEG placed on 10/01/2018 and started tube feeds on 10/03/2018. -She is taking Osmolite 1.5, 4 ounces along with 4 ounces of water 4 times a day.  She is also taking ProMod 1 ounce once a day. -Her weight has been steady around 80 to 81 pounds.       Orders placed this encounter:  No orders of the defined types were placed in this encounter.     Derek Jack, MD [place] [phone]

## 2018-10-21 NOTE — Assessment & Plan Note (Signed)
1.  Non-small cell lung cancer (Tx N3 M0), adenocarcinoma: - Patient noticed lymphadenopathy in the neck region in October 2019.  She also has night sweats but denies any fevers or significant weight loss. - CT of the chest without contrast on 08/12/2018 shows bulky right hilar and mediastinal adenopathy with bilateral supraclavicular adenopathy, scattered small lung lesions nonspecific.  1.2 cm liver lesion incompletely visualized, may represent cyst, hemangioma. - She had a biopsy of the left supra clavicular lymph node on 08/30/2018 which showed metastatic poorly differentiated adenocarcinoma consistent with lung primary.  Tumor was strongly positive for CK7 and TTF-1.  Weak positivity for Napsin-A and MOC-31. - PET/CT scan on 08/23/2018 shows lymphadenopathy in the bilateral supraclavicular regions, mediastinum, subcarinal, right hilar more than left hilar with no pulmonary pathology.  No subdiaphragmatic disease seen.   -MRI of the brain without contrast on 08/24/2018 shows several small areas of nonspecific T2/flair hyperintensity in the cerebral white matter, some with mild T2 shine through on diffusion. -CT scan of the brain with and without contrast on 09/17/2018 shows no intracranial metastasis. -Palliative radiation therapy from 09/14/2018 through 09/28/2018.  She could not lay flat on the table. - Weekly carboplatin and paclitaxel started on 10/07/2018. -She received week 2 of treatment on 10/14/2018.  She felt tired for couple of days. -Denies any neuropathy related side effects.  Denies any nausea or vomiting. -She does have constipation.  I have encouraged her to take fiber supplements.  She will use Dulcolax as needed. -Today we have reviewed her blood work which is stable.  She may proceed with week 3 carboplatin and paclitaxel.  She will come back next week for her week 4 of treatment.  2.  Weight loss: -She has PEG placed on 10/01/2018 and started tube feeds on 10/03/2018. -She is taking  Osmolite 1.5, 4 ounces along with 4 ounces of water 4 times a day.  She is also taking ProMod 1 ounce once a day. -Her weight has been steady around 80 to 81 pounds.

## 2018-10-28 ENCOUNTER — Inpatient Hospital Stay (HOSPITAL_COMMUNITY): Payer: Managed Care, Other (non HMO)

## 2018-10-28 ENCOUNTER — Encounter (HOSPITAL_COMMUNITY): Payer: Self-pay

## 2018-10-28 ENCOUNTER — Other Ambulatory Visit (HOSPITAL_COMMUNITY): Payer: Self-pay | Admitting: Internal Medicine

## 2018-10-28 ENCOUNTER — Encounter (HOSPITAL_COMMUNITY): Payer: Self-pay | Admitting: Dietician

## 2018-10-28 VITALS — BP 105/53 | HR 96 | Temp 98.6°F | Resp 18 | Wt 83.0 lb

## 2018-10-28 DIAGNOSIS — C3490 Malignant neoplasm of unspecified part of unspecified bronchus or lung: Secondary | ICD-10-CM

## 2018-10-28 DIAGNOSIS — Z5111 Encounter for antineoplastic chemotherapy: Secondary | ICD-10-CM | POA: Diagnosis not present

## 2018-10-28 LAB — PHOSPHORUS: Phosphorus: 3.7 mg/dL (ref 2.5–4.6)

## 2018-10-28 LAB — COMPREHENSIVE METABOLIC PANEL
ALT: 9 U/L (ref 0–44)
AST: 15 U/L (ref 15–41)
Albumin: 3.3 g/dL — ABNORMAL LOW (ref 3.5–5.0)
Alkaline Phosphatase: 77 U/L (ref 38–126)
Anion gap: 10 (ref 5–15)
BUN: 11 mg/dL (ref 6–20)
CALCIUM: 9 mg/dL (ref 8.9–10.3)
CO2: 28 mmol/L (ref 22–32)
Chloride: 98 mmol/L (ref 98–111)
Creatinine, Ser: 0.32 mg/dL — ABNORMAL LOW (ref 0.44–1.00)
GFR calc Af Amer: >60 mL/min (ref >60)
GFR calc non Af Amer: >60 mL/min (ref >60)
Glucose, Bld: 93 mg/dL (ref 70–99)
Potassium: 4 mmol/L (ref 3.5–5.1)
SODIUM: 136 mmol/L (ref 135–145)
Total Bilirubin: 0.2 mg/dL — ABNORMAL LOW (ref 0.3–1.2)
Total Protein: 6.3 g/dL — ABNORMAL LOW (ref 6.5–8.1)

## 2018-10-28 LAB — CBC WITH DIFFERENTIAL/PLATELET
Abs Immature Granulocytes: 0.03 10*3/uL (ref 0.00–0.07)
Basophils Absolute: 0 10*3/uL (ref 0.0–0.1)
Basophils Relative: 0 %
Eosinophils Absolute: 0.1 10*3/uL (ref 0.0–0.5)
Eosinophils Relative: 2 %
HCT: 31.7 % — ABNORMAL LOW (ref 36.0–46.0)
Hemoglobin: 9.6 g/dL — ABNORMAL LOW (ref 12.0–15.0)
Immature Granulocytes: 1 %
LYMPHS PCT: 6 %
Lymphs Abs: 0.4 10*3/uL — ABNORMAL LOW (ref 0.7–4.0)
MCH: 27.7 pg (ref 26.0–34.0)
MCHC: 30.3 g/dL (ref 30.0–36.0)
MCV: 91.4 fL (ref 80.0–100.0)
Monocytes Absolute: 0.6 10*3/uL (ref 0.1–1.0)
Monocytes Relative: 9 %
NRBC: 0 % (ref 0.0–0.2)
Neutro Abs: 5.2 10*3/uL (ref 1.7–7.7)
Neutrophils Relative %: 82 %
Platelets: 370 10*3/uL (ref 150–400)
RBC: 3.47 MIL/uL — ABNORMAL LOW (ref 3.87–5.11)
RDW: 16.8 % — ABNORMAL HIGH (ref 11.5–15.5)
WBC: 6.3 10*3/uL (ref 4.0–10.5)

## 2018-10-28 LAB — MAGNESIUM: MAGNESIUM: 2.2 mg/dL (ref 1.7–2.4)

## 2018-10-28 LAB — LACTATE DEHYDROGENASE: LDH: 162 U/L (ref 98–192)

## 2018-10-28 MED ORDER — SODIUM CHLORIDE 0.9 % IV SOLN
Freq: Once | INTRAVENOUS | Status: AC
  Administered 2018-10-28: 11:00:00 via INTRAVENOUS

## 2018-10-28 MED ORDER — HEPARIN SOD (PORK) LOCK FLUSH 100 UNIT/ML IV SOLN
500.0000 [IU] | Freq: Once | INTRAVENOUS | Status: AC | PRN
  Administered 2018-10-28: 500 [IU]

## 2018-10-28 MED ORDER — SODIUM CHLORIDE 0.9 % IV SOLN
137.8000 mg | Freq: Once | INTRAVENOUS | Status: AC
  Administered 2018-10-28: 140 mg via INTRAVENOUS
  Filled 2018-10-28: qty 14

## 2018-10-28 MED ORDER — SODIUM CHLORIDE 0.9% FLUSH
10.0000 mL | INTRAVENOUS | Status: DC | PRN
  Administered 2018-10-28: 10 mL
  Filled 2018-10-28: qty 10

## 2018-10-28 MED ORDER — FAMOTIDINE IN NACL 20-0.9 MG/50ML-% IV SOLN
20.0000 mg | Freq: Once | INTRAVENOUS | Status: AC
  Administered 2018-10-28: 20 mg via INTRAVENOUS
  Filled 2018-10-28: qty 50

## 2018-10-28 MED ORDER — SODIUM CHLORIDE 0.9 % IV SOLN
20.0000 mg | Freq: Once | INTRAVENOUS | Status: AC
  Administered 2018-10-28: 20 mg via INTRAVENOUS
  Filled 2018-10-28: qty 2

## 2018-10-28 MED ORDER — PALONOSETRON HCL INJECTION 0.25 MG/5ML
0.2500 mg | Freq: Once | INTRAVENOUS | Status: AC
  Administered 2018-10-28: 0.25 mg via INTRAVENOUS
  Filled 2018-10-28: qty 5

## 2018-10-28 MED ORDER — DIPHENHYDRAMINE HCL 50 MG/ML IJ SOLN
50.0000 mg | Freq: Once | INTRAMUSCULAR | Status: AC
  Administered 2018-10-28: 50 mg via INTRAVENOUS
  Filled 2018-10-28: qty 1

## 2018-10-28 MED ORDER — SODIUM CHLORIDE 0.9 % IV SOLN
45.0000 mg/m2 | Freq: Once | INTRAVENOUS | Status: AC
  Administered 2018-10-28: 60 mg via INTRAVENOUS
  Filled 2018-10-28: qty 10

## 2018-10-28 NOTE — Progress Notes (Signed)
1030 Lab results and pt's issue with constipation,(pt is taking Colace liquid daily thru G-tube and denies any abd pain or N+V), reviewed with Dr. Modeste Field and pt approved for chemo tx today with instructions to increase water intake PO and thru G-tube as well as trying Miralax daily per MD. Pt given these instructions and verbalized understanding

## 2018-10-28 NOTE — Progress Notes (Signed)
Nutrition Follow up  ASSESSMENT:  63 y/o female. Active smoker w/ 40 pack yr hx. In Dec, developed supraclavicular lymphadenopathy and associated dysphagia. PCP ordered CT chest which showed adenopathy throughout thorax w/ f/u PET confirming hypermetabolic properties. Biopsy 12/30. + for metastatic adenocarcinoma. D/t inability to lie flat for radiotherapy, underwent only short 10-dose course of pallaitive radiation 1/14, w/ plans for full radiotherapy once she can lie flat. S/P peg placement 1/31. Started TF 2/2. Chemo start 2/6  Following up with pt regarding her TF tolerance/progression. Today is her 4th chemo infusion. She says she tolerated her third infusion just as well as she tolerated the first; she gets extremely fatigued for 2 days and then returns to at baseline. Her fatigue apparently does not start until ~48hrs after her chemo. She says w/ receiving chemo today, she expects Sat and Sun to be her "down days".   She says she met w/ Rad Onc again yesterday and had her simulation performed. Per her report, they will do a "dry run" soon - she expects to begin radiation within the next 2 weeks.   Regarding her TF. She reports successfully increase her boluses to 5 oz QID + 30 ml prostat Q24 hrs and 60 ml flushes before/after boluses. She had attempted 5 oz feeds several weeks ago, but had struggled w/ bloating and distension. However upon re attempting it this past week, she did not have any of those previus issues and the adjustment was seemless- her tolerance appears to be gradually rising.    Orally, she says her intake has not changed. SHe still almost exclusively consumes liquids d/t dysphagia. The small amounts of soft solids she consumes is insignificant. Of note, she does not drink water orally- she says she cannot stand the way it tastes. Her oral fluids mostly consists of soft drinks (7 up).   Tolerance wise, she denies any n/v/d. She still has constipation and the interventions  discussed last week did not help. She says the hot beverage tip worked the first time, but has not since. She is still taking the daily stool softener, but says it doesn't help either. She hasnt retried taking the laxative. She did not try the metamucil or prune juice. Regarding her pain, she has this under control w/ liquid ibuprofen. She told RD last week she takes "1 cup-full" TID. She does not know exact volume she takes, but notes the strength is 5 ml = 500 mg.    Her labs are WDL. Renal labs/electrolytes stable.    Fortunately, she continues to gain weight. She is up to 83 lbs. She was 75.8 (34.4 kg) when she begun her tube feeding at the start of this month. As demonstrated in list below, she has been gaining a few lbs each week. Pt is also very pleased by this.   She still is down 11.5 lbs since her initial cancer center visit 12/17 (was 94.5 lbs) and 15 lbs from wt in November (was 98.2 lbs). Her degree of wt loss still meets malnutrition criteria. Prior to her cancer dx, her wt appears to have been stable at 98-102 lbs x5 years, though pt herself says her UBW is closer to 90-95 lbs.   Wt Readings from Last 10 Encounters:  10/28/18 83 lb (37.6 kg)  10/21/18 80 lb 4 oz (36.4 kg)  10/14/18 81 lb (36.7 kg)  10/07/18 78 lb 6 oz (35.6 kg)  10/01/18 75 lb 13.4 oz (34.4 kg)  09/29/18 76 lb (34.5 kg)  09/23/18  82 lb (37.2 kg)  09/21/18 82 lb (37.2 kg)  09/17/18 87 lb 15.4 oz (39.9 kg)  09/07/18 88 lb (39.9 kg)   MEDICATIONS:  Chemotherapy: carboplatin and paclitaxel -Start 2/6  Supportive meds: Hydrocodone (ordered, but doesn't use), compazine, Scopolamine, zofran, diflucan, liquid ibuprofen (children's)  Renal function/electrolytes stable   LABS:   Recent Labs  Lab 10/28/18 0930  NA 136  K 4.0  CL 98  CO2 28  BUN 11  CREATININE 0.32*  CALCIUM 9.0  MG 2.2  PHOS 3.7  GLUCOSE 93     ANTHROPOMETRICS: Height:  Ht Readings from Last 1 Encounters:  10/01/18 '5\' 2"'$  (1.575 m)    Weight:  Wt Readings from Last 1 Encounters:  10/28/18 83 lb (37.6 kg)   BMI:  BMI Readings from Last 1 Encounters:  10/28/18 15.18 kg/m   UBW: Was 98-102 lbs x 5 years prior to December.  IBW: 50 Wt changes:  +7.2 lbs since initiation of TF 3 weeks ago  -11.5 lbs since initial [facility] visit 12/17 (was 94.5 lbs) -15 lbs down from wt in November (was 98.2 lbs).  ReESTIMATED ENERGY NEEDS:  Kcal: 1300-1450 kcals (35-40 kcal/kg bw) Protein: >70g Pro (2 g/kg bw) Fluid: >1.3 L fluid (35 ml/kg bw)  NUTRITION DIAGNOSIS:  Severe malnutrition related to cancer and related dysphagia/postprandial vomiting as evidenced by loss of >10% bw x2 months and severe muscle/fat loss   Ongoing  DOCUMENTATION CODES:  Severe malnutrition (acute vs chronic), underweight  Ongoing, but slowly resolving  INTERVENTION:    Goal TF regimen: 1 can Osmolite 1.5 QID w/ 30 ml Promod. +60 ml flush before / after each bolus -Progressing to goal-  Current TF regimen: 5 oz Osmolite 1.5 QID w/ 60 ml flush before/after each bolus and Promod q 24 hrs w/ 30 ml flush before/after  Current TF provides: 988 kcals, 47g Pro, 453 ml free water (+540 from flushes for total of 902 mls h20)  Will continue the 5 oz feeds this week. Will aim to increase again prior to her initiation of radiation- since this will further increase her kcal, pro and fluid needs. Will strongly considering adding further free water flushes at that time.   Today, we mostly discussed constipation management. She had initial success with the recommendations we discussed last visit, but is not any longer. Given she is not receiving any benefit from the colace, she was directed by MD to try Miralax. RD also supported this idea - especially because this will lead to her increasing her free water intake (powder will need to be mixed in water before putting through tube). Alternatively, she could try the prune juice or metamucil we discussed last week    If these interventions dont work, RD recommended retrying the dulcolax (she didn't use this last week). If that doesn't work, RD noted he would discuss use of stronger laxatives w/ MD/NP. Unfortunately, she says she "cant drink water" d/t intolerace.   Will continue to monitor wt closely and make adjustments as needed. Will see x1 week. They have RD contact information if needed.   GOAL:  Oral + TF intake to meet >90% of needs, weight stability   MET   -Goals for next week: Maintain bolus regimen of 5 oz QID + amelioration of constipation   MONITOR:  Weight, oral intake, level of dysphagia/diet tolerance, treatment plan, Constipation, TF tolerance, labs  Next Visit:  Thursday 3/5  Burtis Junes RD, LDN, CNSC Clinical Nutrition Available Tues-Sat via Pager: [phone]  10/28/2018 12:12 PM

## 2018-10-28 NOTE — Progress Notes (Signed)
Patient tolerated chemotherapy with no complaints voiced.  Port site clean and dry with no bruising or swelling noted at site.  Good blood return noted before and after administration of chemotherapy.  Band aid applied.  Patient left ambulatory with VSS and no s/s of distress noted.

## 2018-10-28 NOTE — Patient Instructions (Signed)
[facility] Discharge Instructions for Patients Receiving Chemotherapy   Beginning January 23rd 2017 lab work for the Cancer Center will be done in the  Main lab at [facility] on 1st floor. If you have a lab appointment with the Cancer Center please come in thru the  Main Entrance and check in at the main information desk   Today you received the following chemotherapy agents Taxol and Carboplatin. Follow-up as scheduled. Call clinic for any questions or concerns  To help prevent nausea and vomiting after your treatment, we encourage you to take your nausea medication.   If you develop nausea and vomiting, or diarrhea that is not controlled by your medication, call the clinic.  The clinic phone number is [phone]. Office hours are Monday-Friday 8:30am-5:00pm.  BELOW ARE SYMPTOMS THAT SHOULD BE REPORTED IMMEDIATELY:  *FEVER GREATER THAN 101.0 F  *CHILLS WITH OR WITHOUT FEVER  NAUSEA AND VOMITING THAT IS NOT CONTROLLED WITH YOUR NAUSEA MEDICATION  *UNUSUAL SHORTNESS OF BREATH  *UNUSUAL BRUISING OR BLEEDING  TENDERNESS IN MOUTH AND THROAT WITH OR WITHOUT PRESENCE OF ULCERS  *URINARY PROBLEMS  *BOWEL PROBLEMS  UNUSUAL RASH Items with * indicate a potential emergency and should be followed up as soon as possible. If you have an emergency after office hours please contact your primary care physician or go to the nearest emergency department.  Please call the clinic during office hours if you have any questions or concerns.   You may also contact the Patient Navigator at [phone] should you have any questions or need assistance in obtaining follow up care.      Resources For Cancer Patients and their Caregivers ? American Cancer Society: Can assist with transportation, wigs, general needs, runs Look Good Feel Better.        1-888-227-6333 ? Cancer Care: Provides financial assistance, online support groups, medication/co-pay assistance.   1-800-813-HOPE (4673) ? [facility] Assists [place] cancer patients and their families through emotional , educational and financial support.  336-427-4357 ? [place] DSS Where to apply for food stamps, Medicaid and utility assistance. 336-342-1394 ? [facility]: Transportation to medical appointments. 336-347-2287 ? Social Security Administration: May apply for disability if have a Stage IV cancer. 336-342-7796 1-800-772-1213 ? [place] Aging, Disability and Transit Services: Assists with nutrition, care and transit needs. 336-349-2343         

## 2018-11-04 ENCOUNTER — Inpatient Hospital Stay (HOSPITAL_COMMUNITY): Payer: Managed Care, Other (non HMO) | Attending: Hematology

## 2018-11-04 ENCOUNTER — Other Ambulatory Visit: Payer: Self-pay

## 2018-11-04 ENCOUNTER — Inpatient Hospital Stay (HOSPITAL_COMMUNITY): Payer: Managed Care, Other (non HMO)

## 2018-11-04 ENCOUNTER — Encounter (HOSPITAL_COMMUNITY): Payer: Self-pay | Admitting: Dietician

## 2018-11-04 ENCOUNTER — Inpatient Hospital Stay (HOSPITAL_BASED_OUTPATIENT_CLINIC_OR_DEPARTMENT_OTHER): Payer: Managed Care, Other (non HMO) | Admitting: Hematology

## 2018-11-04 ENCOUNTER — Encounter (HOSPITAL_COMMUNITY): Payer: Self-pay | Admitting: Hematology

## 2018-11-04 VITALS — BP 98/63 | HR 112 | Temp 98.4°F | Wt 81.8 lb

## 2018-11-04 VITALS — BP 102/66 | HR 107

## 2018-11-04 DIAGNOSIS — Z79899 Other long term (current) drug therapy: Secondary | ICD-10-CM

## 2018-11-04 DIAGNOSIS — C3490 Malignant neoplasm of unspecified part of unspecified bronchus or lung: Secondary | ICD-10-CM | POA: Insufficient documentation

## 2018-11-04 DIAGNOSIS — R05 Cough: Secondary | ICD-10-CM

## 2018-11-04 DIAGNOSIS — F17210 Nicotine dependence, cigarettes, uncomplicated: Secondary | ICD-10-CM

## 2018-11-04 DIAGNOSIS — K1379 Other lesions of oral mucosa: Secondary | ICD-10-CM | POA: Insufficient documentation

## 2018-11-04 DIAGNOSIS — R0989 Other specified symptoms and signs involving the circulatory and respiratory systems: Secondary | ICD-10-CM

## 2018-11-04 DIAGNOSIS — Z931 Gastrostomy status: Secondary | ICD-10-CM

## 2018-11-04 DIAGNOSIS — R634 Abnormal weight loss: Secondary | ICD-10-CM | POA: Insufficient documentation

## 2018-11-04 DIAGNOSIS — B3781 Candidal esophagitis: Principal | ICD-10-CM

## 2018-11-04 DIAGNOSIS — D6481 Anemia due to antineoplastic chemotherapy: Secondary | ICD-10-CM | POA: Diagnosis not present

## 2018-11-04 DIAGNOSIS — Z8249 Family history of ischemic heart disease and other diseases of the circulatory system: Secondary | ICD-10-CM | POA: Insufficient documentation

## 2018-11-04 DIAGNOSIS — Z791 Long term (current) use of non-steroidal anti-inflammatories (NSAID): Secondary | ICD-10-CM | POA: Insufficient documentation

## 2018-11-04 DIAGNOSIS — R5383 Other fatigue: Secondary | ICD-10-CM | POA: Insufficient documentation

## 2018-11-04 DIAGNOSIS — Z5111 Encounter for antineoplastic chemotherapy: Secondary | ICD-10-CM | POA: Insufficient documentation

## 2018-11-04 DIAGNOSIS — B370 Candidal stomatitis: Secondary | ICD-10-CM

## 2018-11-04 LAB — CBC WITH DIFFERENTIAL/PLATELET
Abs Immature Granulocytes: 0.04 10*3/uL (ref 0.00–0.07)
Basophils Absolute: 0 10*3/uL (ref 0.0–0.1)
Basophils Relative: 0 %
Eosinophils Absolute: 0.1 10*3/uL (ref 0.0–0.5)
Eosinophils Relative: 1 %
HEMATOCRIT: 31.9 % — AB (ref 36.0–46.0)
Hemoglobin: 9.9 g/dL — ABNORMAL LOW (ref 12.0–15.0)
Immature Granulocytes: 0 %
LYMPHS ABS: 0.5 10*3/uL — AB (ref 0.7–4.0)
LYMPHS PCT: 5 %
MCH: 28.4 pg (ref 26.0–34.0)
MCHC: 31 g/dL (ref 30.0–36.0)
MCV: 91.4 fL (ref 80.0–100.0)
MONOS PCT: 9 %
Monocytes Absolute: 0.9 10*3/uL (ref 0.1–1.0)
Neutro Abs: 8.4 10*3/uL — ABNORMAL HIGH (ref 1.7–7.7)
Neutrophils Relative %: 85 %
Platelets: 332 10*3/uL (ref 150–400)
RBC: 3.49 MIL/uL — ABNORMAL LOW (ref 3.87–5.11)
RDW: 17.6 % — ABNORMAL HIGH (ref 11.5–15.5)
WBC: 9.9 10*3/uL (ref 4.0–10.5)
nRBC: 0 % (ref 0.0–0.2)

## 2018-11-04 LAB — COMPREHENSIVE METABOLIC PANEL
ALT: 10 U/L (ref 0–44)
AST: 15 U/L (ref 15–41)
Albumin: 3.6 g/dL (ref 3.5–5.0)
Alkaline Phosphatase: 81 U/L (ref 38–126)
Anion gap: 9 (ref 5–15)
BUN: 11 mg/dL (ref 6–20)
CO2: 28 mmol/L (ref 22–32)
Calcium: 8.9 mg/dL (ref 8.9–10.3)
Chloride: 98 mmol/L (ref 98–111)
Creatinine, Ser: 0.3 mg/dL — ABNORMAL LOW (ref 0.44–1.00)
GFR calc Af Amer: >60 mL/min (ref >60)
GFR calc non Af Amer: >60 mL/min (ref >60)
Glucose, Bld: 86 mg/dL (ref 70–99)
Potassium: 4.2 mmol/L (ref 3.5–5.1)
Sodium: 135 mmol/L (ref 135–145)
TOTAL PROTEIN: 6.8 g/dL (ref 6.5–8.1)
Total Bilirubin: 0.3 mg/dL (ref 0.3–1.2)

## 2018-11-04 LAB — MAGNESIUM: Magnesium: 2.3 mg/dL (ref 1.7–2.4)

## 2018-11-04 LAB — PHOSPHORUS: Phosphorus: 3.7 mg/dL (ref 2.5–4.6)

## 2018-11-04 LAB — LACTATE DEHYDROGENASE: LDH: 185 U/L (ref 98–192)

## 2018-11-04 MED ORDER — DIPHENHYDRAMINE HCL 50 MG/ML IJ SOLN
50.0000 mg | Freq: Once | INTRAMUSCULAR | Status: AC
  Administered 2018-11-04: 50 mg via INTRAVENOUS

## 2018-11-04 MED ORDER — SODIUM CHLORIDE 0.9% FLUSH
10.0000 mL | INTRAVENOUS | Status: DC | PRN
  Administered 2018-11-04: 10 mL
  Filled 2018-11-04: qty 10

## 2018-11-04 MED ORDER — PALONOSETRON HCL INJECTION 0.25 MG/5ML
INTRAVENOUS | Status: AC
  Filled 2018-11-04: qty 5

## 2018-11-04 MED ORDER — SODIUM CHLORIDE 0.9 % IV SOLN
137.8000 mg | Freq: Once | INTRAVENOUS | Status: AC
  Administered 2018-11-04: 140 mg via INTRAVENOUS
  Filled 2018-11-04: qty 14

## 2018-11-04 MED ORDER — NYSTATIN 100000 UNIT/ML MT SUSP: 5.0000 mL | Freq: Four times a day (QID) | OROMUCOSAL | 0 refills | Status: AC

## 2018-11-04 MED ORDER — SODIUM CHLORIDE 0.9 % IV SOLN
20.0000 mg | Freq: Once | INTRAVENOUS | Status: AC
  Administered 2018-11-04: 20 mg via INTRAVENOUS
  Filled 2018-11-04: qty 2

## 2018-11-04 MED ORDER — FAMOTIDINE IN NACL 20-0.9 MG/50ML-% IV SOLN
20.0000 mg | Freq: Once | INTRAVENOUS | Status: AC
  Administered 2018-11-04: 20 mg via INTRAVENOUS

## 2018-11-04 MED ORDER — SODIUM CHLORIDE 0.9 % IV SOLN
Freq: Once | INTRAVENOUS | Status: AC
  Administered 2018-11-04: 11:00:00 via INTRAVENOUS

## 2018-11-04 MED ORDER — SODIUM CHLORIDE 0.9 % IV SOLN
45.0000 mg/m2 | Freq: Once | INTRAVENOUS | Status: AC
  Administered 2018-11-04: 60 mg via INTRAVENOUS
  Filled 2018-11-04: qty 10

## 2018-11-04 MED ORDER — DIPHENHYDRAMINE HCL 50 MG/ML IJ SOLN
INTRAMUSCULAR | Status: AC
  Filled 2018-11-04: qty 1

## 2018-11-04 MED ORDER — FAMOTIDINE IN NACL 20-0.9 MG/50ML-% IV SOLN
INTRAVENOUS | Status: AC
  Filled 2018-11-04: qty 50

## 2018-11-04 MED ORDER — HEPARIN SOD (PORK) LOCK FLUSH 100 UNIT/ML IV SOLN
500.0000 [IU] | Freq: Once | INTRAVENOUS | Status: AC | PRN
  Administered 2018-11-04: 500 [IU]

## 2018-11-04 MED ORDER — PALONOSETRON HCL INJECTION 0.25 MG/5ML
0.2500 mg | Freq: Once | INTRAVENOUS | Status: AC
  Administered 2018-11-04: 0.25 mg via INTRAVENOUS

## 2018-11-04 NOTE — Assessment & Plan Note (Signed)
1.  Non-small cell lung cancer (Tx N3 M0), adenocarcinoma: - Patient noticed lymphadenopathy in the neck region in October 2019.  She also has night sweats but denies any fevers or significant weight loss. - CT of the chest without contrast on 08/12/2018 shows bulky right hilar and mediastinal adenopathy with bilateral supraclavicular adenopathy, scattered small lung lesions nonspecific.  1.2 cm liver lesion incompletely visualized, may represent cyst, hemangioma. - She had a biopsy of the left supra clavicular lymph node on 08/30/2018 which showed metastatic poorly differentiated adenocarcinoma consistent with lung primary.  Tumor was strongly positive for CK7 and TTF-1.  Weak positivity for Napsin-A and MOC-31. - PET/CT scan on 08/23/2018 shows lymphadenopathy in the bilateral supraclavicular regions, mediastinum, subcarinal, right hilar more than left hilar with no pulmonary pathology.  No subdiaphragmatic disease seen.   -MRI of the brain without contrast on 08/24/2018 shows several small areas of nonspecific T2/flair hyperintensity in the cerebral white matter, some with mild T2 shine through on diffusion. -CT scan of the brain with and without contrast on 09/17/2018 shows no intracranial metastasis. -Palliative radiation therapy from 09/14/2018 through 09/28/2018.  She could not lay flat on the table. - Weekly carboplatin and paclitaxel started on 10/07/2018.  - She received cycle 4 on 10/28/2018.  She is tolerating it very well. -She is maintaining her weight.  She is able to lie flat without any respiratory difficulty. -She may proceed with cycle 5 today.  She will be reevaluated next week.  She has mild anemia from cytotoxic chemotherapy.  We will watch it closely.  She does not require any transfusion. - I spoke with Dr.Yanagihara.  She underwent simulation scan on 10/28/2018.  She will potentially start sequential radiation in the next 1 to 2 weeks.  2.  Weight loss: -She has PEG placed on  10/01/2018 and started tube feeds on 10/03/2018. -She is taking Osmolite 1.5, 4 ounces along with 4 ounces of water 4 times a day.  She is also taking ProMod 1 ounce once a day. -Her weight has been steady around 80 to 81 pounds.

## 2018-11-04 NOTE — Progress Notes (Signed)
[address] [place], [place] [address]   CLINIC:  Medical Oncology/Hematology  PCP:  Sharion Balloon, [place] [address] [phone]   REASON FOR VISIT: Follow-up for adenocarcinoma of the lung  CURRENT THERAPY:chemoradiationCarboplatin,paclitaxel weekly  BRIEF ONCOLOGIC HISTORY:    Adenocarcinoma of lung ([place])   09/07/2018 Initial Diagnosis    Adenocarcinoma of lung ([place])    10/07/2018 -  Chemotherapy    The patient had palonosetron (ALOXI) injection 0.25 mg, 0.25 mg, Intravenous,  Once, 5 of 6 cycles Administration: 0.25 mg (10/07/2018), 0.25 mg (10/14/2018), 0.25 mg (10/21/2018), 0.25 mg (10/28/2018) CARBOplatin (PARAPLATIN) 100 mg in sodium chloride 0.9 % 100 mL chemo infusion, 100 mg (100 % of original dose 100 mg), Intravenous,  Once, 5 of 6 cycles Dose modification: 100 mg (original dose 100 mg, Cycle 1, Reason: Provider Judgment),   (original dose 137.8 mg, Cycle 2),   (original dose 137.8 mg, Cycle 3),   (original dose 137.8 mg, Cycle 5),   (original dose 137.8 mg, Cycle 4) Administration: 100 mg (10/07/2018), 140 mg (10/14/2018), 140 mg (10/21/2018), 140 mg (10/28/2018) PACLitaxel (TAXOL) 60 mg in sodium chloride 0.9 % 150 mL chemo infusion (</= 80mg /m2), 45 mg/m2 = 60 mg, Intravenous,  Once, 5 of 6 cycles Administration: 60 mg (10/07/2018), 60 mg (10/14/2018), 60 mg (10/21/2018), 60 mg (10/28/2018)  for chemotherapy treatment.       INTERVAL HISTORY:  Erika Turner 63 y.o. female returns for routine follow-up for adenocarcinoma of the lung. She is here with her husband. She has been maintaining her weight well. She has been working with the nutritionist to help her achieve weight goals. She is starting to have more regular bowel movements with prune juice. She has congestion in her chest over the past week and she reports it hurts slightly in her chest only when she takes a deep breathe. She occasionally coughs up blood tinged  sputum. She denies any green or yellow sputum. Denies fevers. Denies any nausea, vomiting, or diarrhea. Denies any new pains. Had not noticed any recent bleeding such as epistaxis, hematuria or hematochezia. Denies recent chest pain on exertion, shortness of breath on minimal exertion, pre-syncopal episodes, or palpitations. Denies any numbness or tingling in hands or feet. Denies any recent fevers, infections, or recent hospitalizations. Patient reports appetite at 25% and energy level at 50%. She is continuing to take her tube feedings as previously set up and she is tolerating them well.   REVIEW OF SYSTEMS:  Review of Systems  Constitutional: Positive for fatigue.  HENT:   Positive for mouth sores.   Respiratory: Positive for cough.   Musculoskeletal: Positive for back pain.  Psychiatric/Behavioral: Positive for sleep disturbance.  All other systems reviewed and are negative.    PAST MEDICAL/SURGICAL HISTORY:  Past Medical History:  Diagnosis Date  . GAD (generalized anxiety disorder) 01/21/2017  . Lymphadenopathy   . Underweight 01/21/2017   Past Surgical History:  Procedure Laterality Date  . ABDOMINAL HYSTERECTOMY    . CESAREAN SECTION     x 2  . ESOPHAGOGASTRODUODENOSCOPY (EGD) WITH PROPOFOL N/A 10/01/2018   Procedure: ESOPHAGOGASTRODUODENOSCOPY (EGD) WITH PROPOFOL;  Surgeon: Aviva Signs, MD;  Location: [facility];  Service: General;  Laterality: N/A;  . LYMPH NODE BIOPSY Left 08/30/2018   Procedure: CERVICAL LYMPH NODE BIOPSY;  Surgeon: Aviva Signs, MD;  Location: [facility];  Service: General;  Laterality: Left;  . PEG PLACEMENT N/A 10/01/2018   Procedure: PERCUTANEOUS  ENDOSCOPIC GASTROSTOMY (PEG) PLACEMENT;  Surgeon: Aviva Signs, MD;  Location: [facility];  Service: General;  Laterality: N/A;  . PORTACATH PLACEMENT Right 09/17/2018   Procedure: INSERTION PORT-A-CATH (ATTACHED CATHETER IN RIGHT SUBCLAVIAN);  Surgeon: Aviva Signs, MD;  Location: [facility];  Service: General;   Laterality: Right;     SOCIAL HISTORY:  Social History   Socioeconomic History  . Marital status: Married    Spouse name: Not on file  . Number of children: 2  . Years of education: Not on file  . Highest education level: Not on file  Occupational History  . Occupation: housekeeper  Social Needs  . Financial resource strain: Not hard at all  . Food insecurity:    Worry: Never true    Inability: Never true  . Transportation needs:    Medical: No    Non-medical: No  Tobacco Use  . Smoking status: Current Every Day Smoker    Packs/day: 1.00    Years: 40.00    Pack years: 40.00    Types: Cigarettes    Start date: 11/02/1978  . Smokeless tobacco: Never Used  Substance and Sexual Activity  . Alcohol use: No  . Drug use: No  . Sexual activity: Not on file  Lifestyle  . Physical activity:    Days per week: 0 days    Minutes per session: 0 min  . Stress: To some extent  Relationships  . Social connections:    Talks on phone: Twice a week    Gets together: Never    Attends religious service: Never    Active member of club or organization: No    Attends meetings of clubs or organizations: Never    Relationship status: Married  . Intimate partner violence:    Fear of current or ex partner: No    Emotionally abused: No    Physically abused: No    Forced sexual activity: No  Other Topics Concern  . Not on file  Social History Narrative   Housekeeping at [employer].    FAMILY HISTORY:  Family History  Problem Relation Age of Onset  . Diabetes Mother   . Heart disease Mother        Pacemaker   . Cancer Father        Lung  . Cancer Brother        bladder then colon  . Down syndrome Brother   . Mental illness Daughter     CURRENT MEDICATIONS:  Outpatient Encounter Medications as of 11/04/2018  Medication Sig  . CARBOPLATIN IV Inject into the vein once a week.  . diphenhydramine-acetaminophen (TYLENOL PM) 25-500 MG TABS tablet Take 1 tablet by mouth at  bedtime.   Marland Kitchen escitalopram (LEXAPRO) 10 MG tablet Take 1 tablet (10 mg total) by mouth daily.  . Fluconazole (DIFLUCAN PO) Take by mouth.  . fluconazole (DIFLUCAN) 100 MG tablet Take 1 tablet (100 mg total) by mouth daily.  Marland Kitchen HYDROcodone-acetaminophen (NORCO) 5-325 MG tablet Take 1 tablet by mouth every 6 (six) hours as needed for moderate pain.  Marland Kitchen ibuprofen (ADVIL,MOTRIN) 200 MG tablet Take 200-400 mg by mouth 2 (two) times daily as needed for moderate pain.  . Ipratropium-Albuterol (COMBIVENT) 20-100 MCG/ACT AERS respimat Inhale 1 puff into the lungs every 6 (six) hours.  . lidocaine-prilocaine (EMLA) cream Apply to affected area once  . ondansetron (ZOFRAN ODT) 4 MG disintegrating tablet Take 1 tablet (4 mg total) by mouth every 8 (eight) hours as needed for nausea or  vomiting.  Marland Kitchen PACLitaxel (TAXOL IV) Inject into the vein once a week.  . prochlorperazine (COMPAZINE) 10 MG tablet Take 1 tablet (10 mg total) by mouth every 6 (six) hours as needed for nausea or vomiting.  Marland Kitchen scopolamine (TRANSDERM-SCOP) 1 MG/3DAYS Place 1 patch (1.5 mg total) onto the skin every 3 (three) days.  Marland Kitchen Spacer/Aero-Holding Chambers (BREATHERITE COLL SPACER ADULT) MISC 1 Device by Does not apply route every 6 (six) hours.  Marland Kitchen nystatin (MYCOSTATIN) 100000 UNIT/ML suspension Take 5 mLs (500,000 Units total) by mouth 4 (four) times daily.   No facility-administered encounter medications on file as of 11/04/2018.     ALLERGIES:  Allergies  Allergen Reactions  . Biaxin [Clarithromycin] Nausea And Vomiting     PHYSICAL EXAM:  ECOG Performance status: 1  Vitals:   11/04/18 1017  BP: 98/63  Pulse: (!) 112  Temp: 98.4 F (36.9 C)  SpO2: 100%   Filed Weights   11/04/18 1017  Weight: 81 lb 12.8 oz (37.1 kg)    Physical Exam Constitutional:      Appearance: Normal appearance. She is normal weight.  Cardiovascular:     Rate and Rhythm: Normal rate and regular rhythm.     Heart sounds: Normal heart sounds.    Pulmonary:     Effort: Pulmonary effort is normal.     Breath sounds: Normal breath sounds.  Musculoskeletal: Normal range of motion.  Skin:    General: Skin is warm and dry.  Neurological:     Mental Status: She is alert and oriented to person, place, and time. Mental status is at baseline.  Psychiatric:        Mood and Affect: Mood normal.        Behavior: Behavior normal.        Thought Content: Thought content normal.        Judgment: Judgment normal.      LABORATORY DATA:  I have reviewed the labs as listed.  CBC    Component Value Date/Time   WBC 9.9 11/04/2018 0937   RBC 3.49 (L) 11/04/2018 0937   HGB 9.9 (L) 11/04/2018 0937   HGB 12.5 07/02/2018 1847   HCT 31.9 (L) 11/04/2018 0937   HCT 37.3 07/02/2018 1847   PLT 332 11/04/2018 0937   PLT 407 07/02/2018 1847   MCV 91.4 11/04/2018 0937   MCV 86 07/02/2018 1847   MCH 28.4 11/04/2018 0937   MCHC 31.0 11/04/2018 0937   RDW 17.6 (H) 11/04/2018 0937   RDW 12.4 07/02/2018 1847   LYMPHSABS 0.5 (L) 11/04/2018 0937   LYMPHSABS 2.1 07/02/2018 1847   MONOABS 0.9 11/04/2018 0937   EOSABS 0.1 11/04/2018 0937   EOSABS 0.1 07/02/2018 1847   BASOSABS 0.0 11/04/2018 0937   BASOSABS 0.1 07/02/2018 1847   CMP Latest Ref Rng & Units 11/04/2018 10/28/2018 10/21/2018  Glucose 70 - 99 mg/dL 86 93 115(H)  BUN 6 - 20 mg/dL 11 11 12   Creatinine 0.44 - 1.00 mg/dL 0.30(L) 0.32(L) 0.32(L)  Sodium 135 - 145 mmol/L 135 136 135  Potassium 3.5 - 5.1 mmol/L 4.2 4.0 3.6  Chloride 98 - 111 mmol/L 98 98 97(L)  CO2 22 - 32 mmol/L 28 28 29   Calcium 8.9 - 10.3 mg/dL 8.9 9.0 8.8(L)  Total Protein 6.5 - 8.1 g/dL 6.8 6.3(L) 6.5  Total Bilirubin 0.3 - 1.2 mg/dL 0.3 0.2(L) 0.2(L)  Alkaline Phos 38 - 126 U/L 81 77 63  AST 15 - 41 U/L 15 15 17   ALT  0 - 44 U/L 10 9 11        DIAGNOSTIC IMAGING:  I have independently reviewed the scans and discussed with the patient.   I have reviewed Francene Finders, NP's note and agree with the documentation.   I personally performed a face-to-face visit, made revisions and my assessment and plan is as follows.    ASSESSMENT & PLAN:   Adenocarcinoma of lung ([place]) 1.  Non-small cell lung cancer (Tx N3 M0), adenocarcinoma: - Patient noticed lymphadenopathy in the neck region in October 2019.  She also has night sweats but denies any fevers or significant weight loss. - CT of the chest without contrast on 08/12/2018 shows bulky right hilar and mediastinal adenopathy with bilateral supraclavicular adenopathy, scattered small lung lesions nonspecific.  1.2 cm liver lesion incompletely visualized, may represent cyst, hemangioma. - She had a biopsy of the left supra clavicular lymph node on 08/30/2018 which showed metastatic poorly differentiated adenocarcinoma consistent with lung primary.  Tumor was strongly positive for CK7 and TTF-1.  Weak positivity for Napsin-A and MOC-31. - PET/CT scan on 08/23/2018 shows lymphadenopathy in the bilateral supraclavicular regions, mediastinum, subcarinal, right hilar more than left hilar with no pulmonary pathology.  No subdiaphragmatic disease seen.   -MRI of the brain without contrast on 08/24/2018 shows several small areas of nonspecific T2/flair hyperintensity in the cerebral white matter, some with mild T2 shine through on diffusion. -CT scan of the brain with and without contrast on 09/17/2018 shows no intracranial metastasis. -Palliative radiation therapy from 09/14/2018 through 09/28/2018.  She could not lay flat on the table. - Weekly carboplatin and paclitaxel started on 10/07/2018.  - She received cycle 4 on 10/28/2018.  She is tolerating it very well. -She is maintaining her weight.  She is able to lie flat without any respiratory difficulty. -She may proceed with cycle 5 today.  She will be reevaluated next week.  She has mild anemia from cytotoxic chemotherapy.  We will watch it closely.  She does not require any transfusion. - I spoke with Dr.Yanagihara.  She  underwent simulation scan on 10/28/2018.  She will potentially start sequential radiation in the next 1 to 2 weeks.  2.  Weight loss: -She has PEG placed on 10/01/2018 and started tube feeds on 10/03/2018. -She is taking Osmolite 1.5, 4 ounces along with 4 ounces of water 4 times a day.  She is also taking ProMod 1 ounce once a day. -Her weight has been steady around 80 to 81 pounds.      Orders placed this encounter:  No orders of the defined types were placed in this encounter.     Derek Jack, MD [place] [phone]

## 2018-11-04 NOTE — Patient Instructions (Signed)
[place] [facility] Discharge Instructions  You were seen today by Dr. Delton Coombes, he went over how you've been feeling and how your appetite has been. He discussed your radiation plan with you. We will see you back next week for labs, Treatment and follow up.    Thank you for choosing [place] at [facility] to provide your oncology and hematology care.  To afford each patient quality time with our provider, please arrive at least 15 minutes before your scheduled appointment time.   If you have a lab appointment with the [facility] please come in thru the  Main Entrance and check in at the main information desk  You need to re-schedule your appointment should you arrive 10 or more minutes late.  We strive to give you quality time with our providers, and arriving late affects you and other patients whose appointments are after yours.  Also, if you no show three or more times for appointments you may be dismissed from the clinic at the providers discretion.     Again, thank you for choosing [facility].  Our hope is that these requests will decrease the amount of time that you wait before being seen by our physicians.       _____________________________________________________________  Should you have questions after your visit to [facility], please contact our office at [phone] between the hours of 8:00 a.m. and 4:30 p.m.  Voicemails left after 4:00 p.m. will not be returned until the following business day.  For prescription refill requests, have your pharmacy contact our office and allow 72 hours.    Cancer Center Support Programs:   > Cancer Support Group  2nd Tuesday of the month 1pm-2pm, Journey Room

## 2018-11-04 NOTE — Progress Notes (Signed)
Nutrition Follow up  ASSESSMENT:  63 y/o female. Active smoker w/ 40 pack yr hx. In Dec, developed supraclavicular lymphadenopathy and associated dysphagia. PCP ordered CT chest which showed adenopathy throughout thorax w/ f/u PET confirming hypermetabolic properties. Biopsy 12/30. + for metastatic adenocarcinoma. D/t inability to lie flat for radiotherapy, underwent only short 10-dose course of pallaitive radiation 1/14, w/ plans for full radiotherapy once she can lie flat. S/P peg placement 1/31. Started TF 2/2. Chemo start 2/6  Following up with pt regarding her TF tolerance/progression. Today is her 5th chemo infusion. She says she tolerated her fourth infusion just as well as she had the ones prior; she will get extremely fatigued for a couple days and then return to baseline. Her fatigue doesnt start until ~48hrs after her chemo. She says w/ receiving chemo today, she expects Sat and Sun to be her "down days".   She has not met again with Rad Onc since last visit, but It is still her understanding that radiation will commence in next week or so.   Regarding her TF. She reports that she has continued to tolerate the 5 oz of TF QID. She also reports continued compliance with the Prostat modular and free water flushes.    Orally, she says her intake has not changed. SHe still almost exclusively consumes liquids d/t dysphagia. The small amounts of soft solids she consumes is insignificant. Of note, she does not drink water orally- she says she cannot stand the way it tastes. Her oral fluids mostly consists of soft drinks (7 up).   Thankfully, pt has successfully alleviated her constipation with prune juice. While regular laxatives and softeners had no effect, she says a few ounces of prune juice "cleared me out". She says she plans to take 1 oz via PEG QOD for prophylaxis. Regarding her pain, she has this under control w/ liquid ibuprofen. She told RD prior she takes "1 cup-full" TID. She does not  know exact volume she takes, but notes the strength is 5 ml = 500 mg.    Her labs are WDL. Renal labs/electrolytes stable.    Her weight today is 81.8 lbs. She dropped a lb in past week and pt is upset by this. She was 75.8 (34.4 kg) when she begun her tube feeding at the start of Feb. She has been 80-83 lbs for the past 3 weeks.   She is down 12.5 lbs since her initial cancer center visit 12/17 (was 94.5 lbs) and 16 lbs from wt in November (was 98.2 lbs). Her degree of wt loss still meets malnutrition criteria. Prior to her cancer dx, her wt appeared to have been 98-102 lbs x5 years, though pt herself says her UBW is closer to 90-95 lbs.   Wt Readings from Last 10 Encounters:  11/04/18 81 lb 12.8 oz (37.1 kg)  10/28/18 83 lb (37.6 kg)  10/21/18 80 lb 4 oz (36.4 kg)  10/14/18 81 lb (36.7 kg)  10/07/18 78 lb 6 oz (35.6 kg)  10/01/18 75 lb 13.4 oz (34.4 kg)  09/29/18 76 lb (34.5 kg)  09/23/18 82 lb (37.2 kg)  09/21/18 82 lb (37.2 kg)  09/17/18 87 lb 15.4 oz (39.9 kg)   MEDICATIONS:  Chemotherapy: carboplatin and paclitaxel -Start 2/6  Supportive meds: Hydrocodone (ordered, but doesn't use), compazine, Scopolamine, zofran, diflucan, liquid ibuprofen (children's)  Renal function/electrolytes stable   LABS:   Recent Labs  Lab 11/04/18 0937  NA 135  K 4.2  CL 98  CO2 28  BUN 11  CREATININE 0.30*  CALCIUM 8.9  MG 2.3  PHOS 3.7  GLUCOSE 86     ANTHROPOMETRICS: Height:  Ht Readings from Last 1 Encounters:  10/01/18 '5\' 2"'$  (1.575 m)   Weight:  Wt Readings from Last 1 Encounters:  11/04/18 81 lb 12.8 oz (37.1 kg)   BMI:  BMI Readings from Last 1 Encounters:  11/04/18 14.96 kg/m   UBW: Was 98-102 lbs x 5 years prior to December.  Wt changes:  +6 lbs since initiation of TF 3 weeks ago  -12.5 lbs since initial [facility] visit 12/17 (was 94.5 lbs) -16 lbs down from wt in November (was 98.2 lbs).  ReESTIMATED ENERGY NEEDS:  Kcal: 1300-1500 kcals (35-40 kcal/kg bw) Protein:  >65g Pro (2 g/kg bw) Fluid: >1.3 L fluid (35 ml/kg bw)  NUTRITION DIAGNOSIS:  Severe malnutrition related to cancer and related dysphagia/postprandial vomiting as evidenced by loss of >10% bw x4 months and severe muscle/fat loss   Ongoing  DOCUMENTATION CODES:  Severe malnutrition (acute vs chronic), underweight  Ongoing  INTERVENTION:    Goal TF regimen: 1 can Osmolite 1.5 QID w/ 30 ml Promod. +60 ml flush before / after each bolus -Progressing to goal-  Current TF regimen: 5 oz Osmolite 1.5 QID w/ 60 ml flush before/after each bolus and Promod q 24 hrs w/ 30 ml flush before/after  Current TF provides: 988 kcals, 47g Pro, 453 ml free water (+540 from flushes for total of 902 mls h20)  RD helped calm pts anxiety regarding her modest weight loss - listed several benign reasons why she may be showing a loss of a lb. Emphasized she should not read too much into this. That said, RD did tell pt that he did not believe she was meeting 100% of her kcal needs with the tube feeding alone. Reviewed that her TF goal that we had put forth at start was 1 can QID and currently, she is barely halfway to this goal. As such, RD asked if she felt she could titrate her feeds up again. She thought she could. Plan for coming week is for 6 oz feeds QID. She will continue to use prune juice for constipation as she has had great success with this.   Patient is doing fantastic overall. Will continue to monitor wt closely and make adjustments as needed. Will see x1 week. They have RD contact information if needed.   GOAL:  Oral + TF intake to meet >90% of needs, weight stability, amelioration of constipation  ALL MET   -Goal for next week: Increase bolus size from 5 to 6 oz  MONITOR:  Weight, oral intake, level of dysphagia/diet tolerance, treatment plan-start of radiation, Constipation, TF tolerance, labs  Next Visit:  Thursday 3/12  Burtis Junes RD, LDN, CNSC Clinical Nutrition Available Tues-Sat  via Pager: [phone] 11/04/2018 10:29 AM

## 2018-11-04 NOTE — Progress Notes (Signed)
1100 labs and vitals reviewed, pt seen by Dr. Delton Coombes who approves pt for treatment today.  Erika Turner tolerated treatment without incident or complaint. VSS. Discharged self ambulatory in satisfactory condition in presence of husband.

## 2018-11-04 NOTE — Patient Instructions (Signed)
[facility] Discharge Instructions for Patients Receiving Chemotherapy   Beginning January 23rd 2017 lab work for the [facility] will be done in the  Main lab at [facility] on 1st floor. If you have a lab appointment with the [facility] please come in thru the  Main Entrance and check in at the main information desk   Today you received the following chemotherapy agents Carboplatin and Taxol.  To help prevent nausea and vomiting after your treatment, we encourage you to take your nausea medication   If you develop nausea and vomiting, or diarrhea that is not controlled by your medication, call the clinic.  The clinic phone number is [phone]. Office hours are Monday-Friday 8:30am-5:00pm.  BELOW ARE SYMPTOMS THAT SHOULD BE REPORTED IMMEDIATELY:  *FEVER GREATER THAN 101.0 F  *CHILLS WITH OR WITHOUT FEVER  NAUSEA AND VOMITING THAT IS NOT CONTROLLED WITH YOUR NAUSEA MEDICATION  *UNUSUAL SHORTNESS OF BREATH  *UNUSUAL BRUISING OR BLEEDING  TENDERNESS IN MOUTH AND THROAT WITH OR WITHOUT PRESENCE OF ULCERS  *URINARY PROBLEMS  *BOWEL PROBLEMS  UNUSUAL RASH Items with * indicate a potential emergency and should be followed up as soon as possible. If you have an emergency after office hours please contact your primary care physician or go to the nearest emergency department.  Please call the clinic during office hours if you have any questions or concerns.   You may also contact the Patient Navigator at [phone] should you have any questions or need assistance in obtaining follow up care.      Resources For Cancer Patients and their Caregivers ? American Cancer Society: Can assist with transportation, wigs, general needs, runs Look Good Feel Better.        812-289-9003 ? Cancer Care: Provides financial assistance, online support groups, medication/co-pay assistance.  1-800-813-HOPE 502-116-3171) ? [facility] Assists  [place] cancer patients and their families through emotional , educational and financial support.  437-445-2780 ? [place] DSS Where to apply for food stamps, Medicaid and utility assistance. 782-106-3353 ? [facility]: Transportation to medical appointments. 707-416-3857 ? Social Security Administration: May apply for disability if have a Stage IV cancer. ([phone] 579-307-3751 ? [employer], Disability and Transit Services: Assists with nutrition, care and transit needs. 365-373-1734

## 2018-11-12 ENCOUNTER — Other Ambulatory Visit (HOSPITAL_COMMUNITY): Payer: Managed Care, Other (non HMO)

## 2018-11-12 ENCOUNTER — Ambulatory Visit (HOSPITAL_COMMUNITY): Payer: Managed Care, Other (non HMO)

## 2018-11-12 ENCOUNTER — Ambulatory Visit (HOSPITAL_COMMUNITY): Payer: Managed Care, Other (non HMO) | Admitting: Hematology

## 2018-11-16 ENCOUNTER — Other Ambulatory Visit (HOSPITAL_COMMUNITY): Payer: Self-pay | Admitting: *Deleted

## 2018-11-16 DIAGNOSIS — B370 Candidal stomatitis: Secondary | ICD-10-CM

## 2018-11-16 DIAGNOSIS — B3781 Candidal esophagitis: Principal | ICD-10-CM

## 2018-11-16 MED ORDER — FLUCONAZOLE 100 MG PO TABS: ORAL_TABLET | ORAL | 0 refills | Status: AC

## 2018-11-18 ENCOUNTER — Ambulatory Visit (HOSPITAL_COMMUNITY): Payer: Managed Care, Other (non HMO)

## 2018-11-18 ENCOUNTER — Ambulatory Visit (HOSPITAL_COMMUNITY): Payer: Managed Care, Other (non HMO) | Admitting: Hematology

## 2018-11-18 ENCOUNTER — Other Ambulatory Visit (HOSPITAL_COMMUNITY): Payer: Self-pay | Admitting: Nurse Practitioner

## 2018-11-18 ENCOUNTER — Other Ambulatory Visit (HOSPITAL_COMMUNITY): Payer: Managed Care, Other (non HMO)

## 2018-11-29 ENCOUNTER — Telehealth (HOSPITAL_COMMUNITY): Payer: Self-pay | Admitting: *Deleted

## 2018-11-29 NOTE — Telephone Encounter (Signed)
Funeral home called stating that the state called and are not accepting Dr. Tomie China signature. They states that the state needs a letter proving that it is in fact his signature.

## 2018-12-01 NOTE — Telephone Encounter (Signed)
I received a call from [facility], [place], RN states that patient is there in clinic and their physician is not in office. She states that patient has thrush and a very foul odor from her oral cavity.   Diflucan called into patients pharmacy and patient is to take it via PEG tube as she is also having trouble swallowing.

## 2018-12-01 DEATH — deceased

## 2020-03-24 IMAGING — CT CT CHEST W/O CM
2 of 3 series · 15 of 36 positions shown, 18 images · non-contrast
Comparison: None.

CLINICAL DATA: recheck lymphadenopathy. She has lymph node swelling
supraclavicular and right arm. She was seen on 07/02/18 and
07/21/18. CT chest was ordered, but states she had a death in the
family and it was rescheduled 08/12/18. She is also complaining of
globus sensation and a referral to GI was made. She states her appt
is not until [place]. She had a normal CBC and CMP. She states she
just does not feel well and her symptoms are worsening. She smokes a
pack a day for 40 years.

EXAM:
CT CHEST WITHOUT CONTRAST
TECHNIQUE: Multidetector CT imaging of the chest was performed following the
standard protocol without IV contrast.

[Series 2: thorax · axial · 0.59mm/px · z∈[+1249,+1515]mm · 12 of 157 slices shown, 15 images]
[im 12/157  mediastinal]
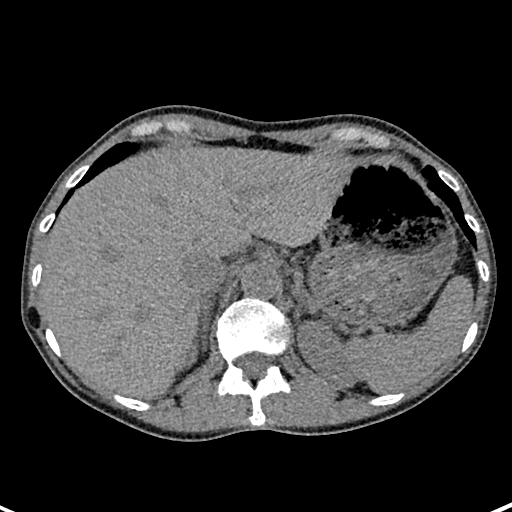
[im 12/157  lung]
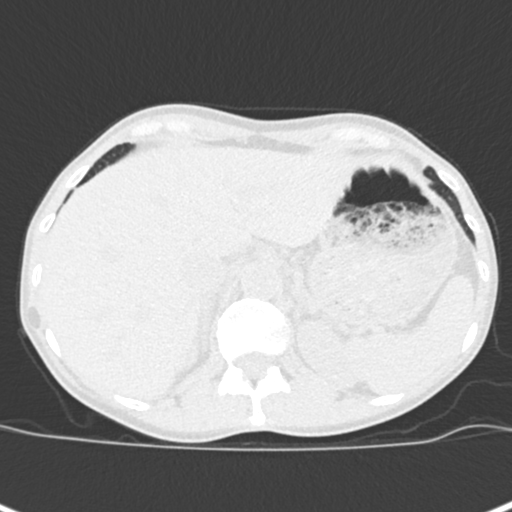
[im 24/157  lung]
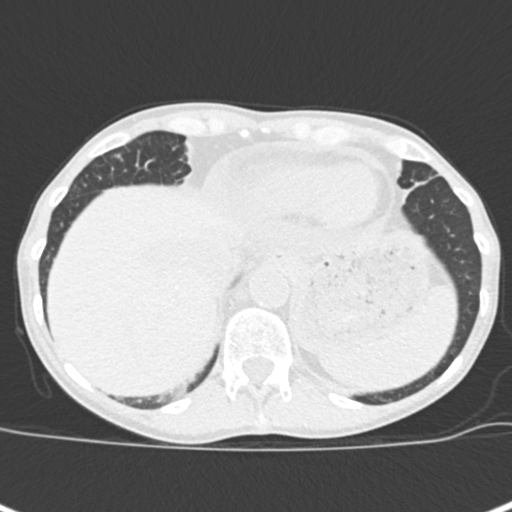
[im 35/157  lung]
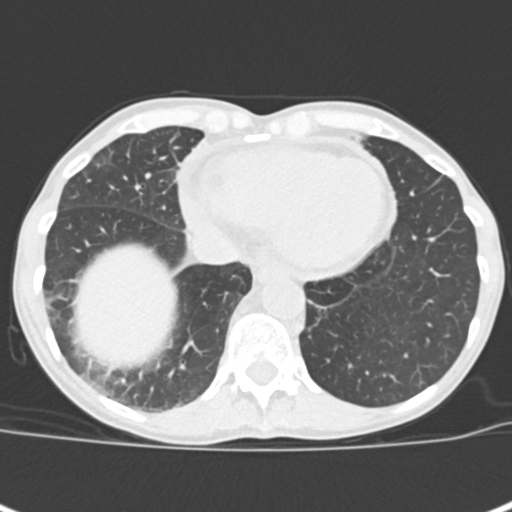
[im 47/157  lung]
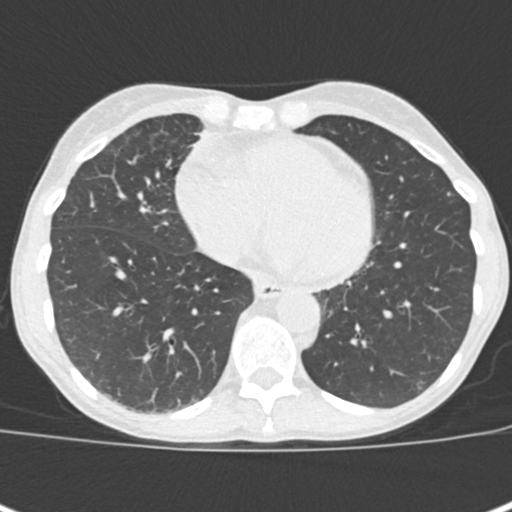
[im 58/157  mediastinal]
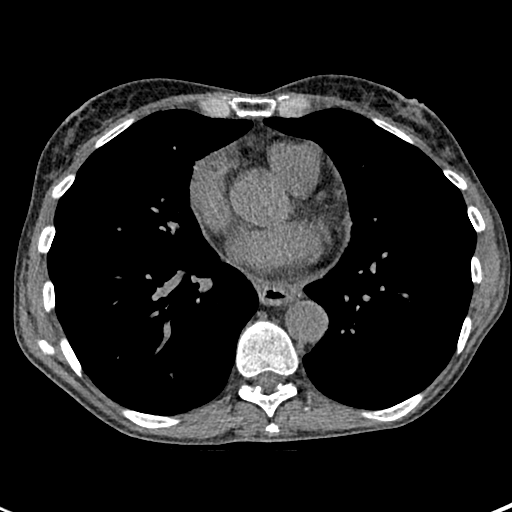
[im 58/157  lung]
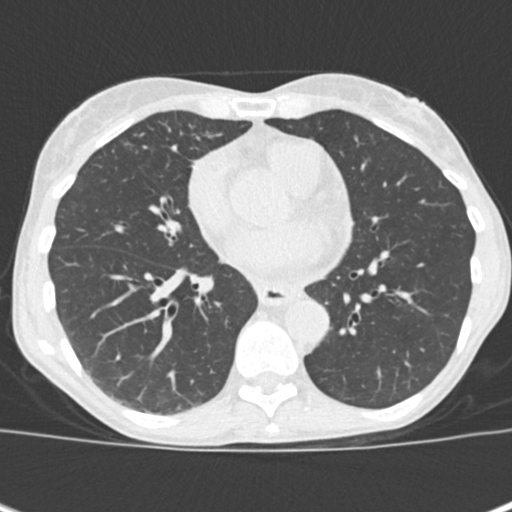
[im 70/157  lung]
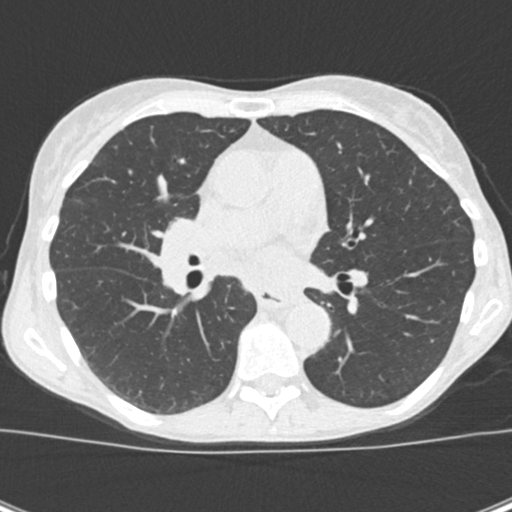
[im 87/157  lung]
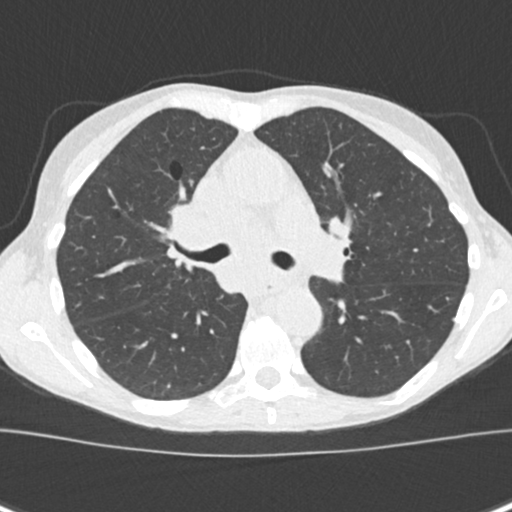
[im 99/157  lung]
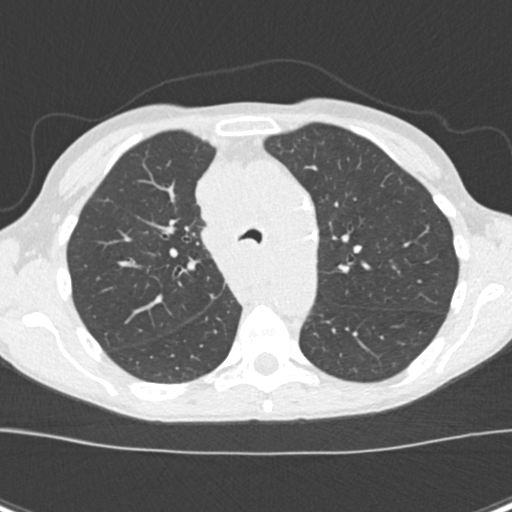
[im 110/157  mediastinal]
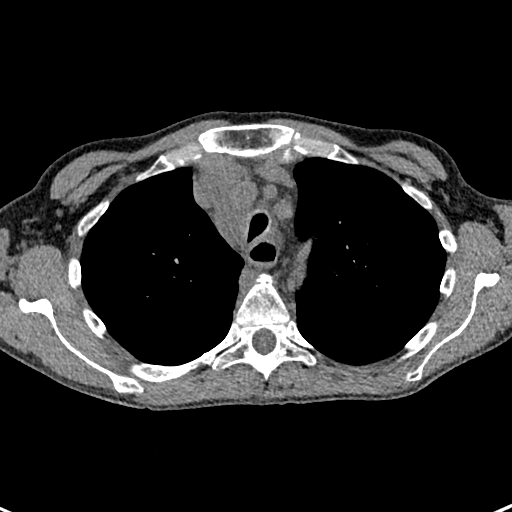
[im 110/157  lung]
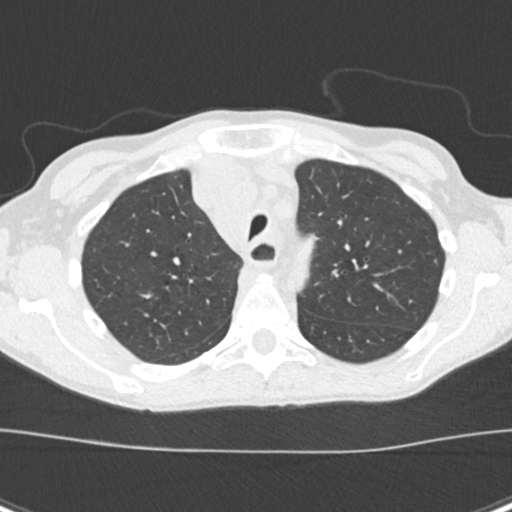
[im 122/157  lung]
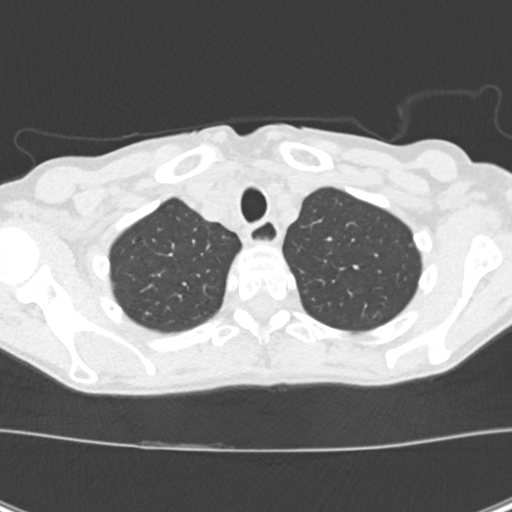
[im 133/157  lung]
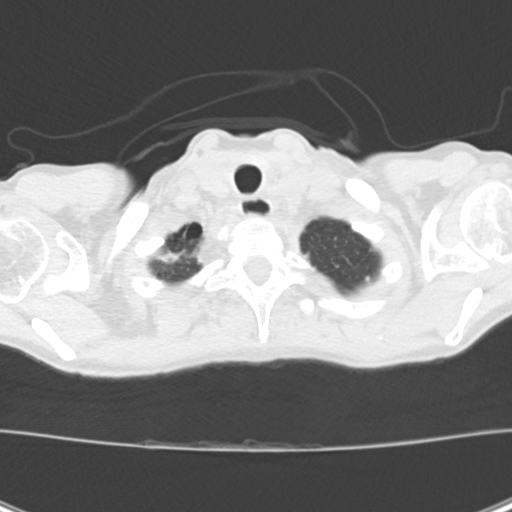
[im 145/157  lung]
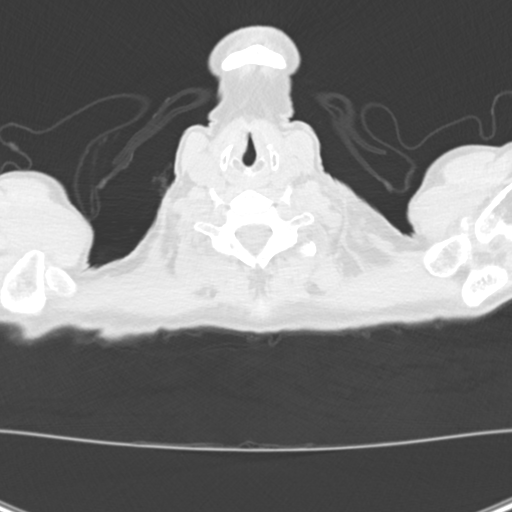

[Series 5: coronal · coronal · 0.64mm/px · 3 of 136 slices shown]
[im 28/136  lung]
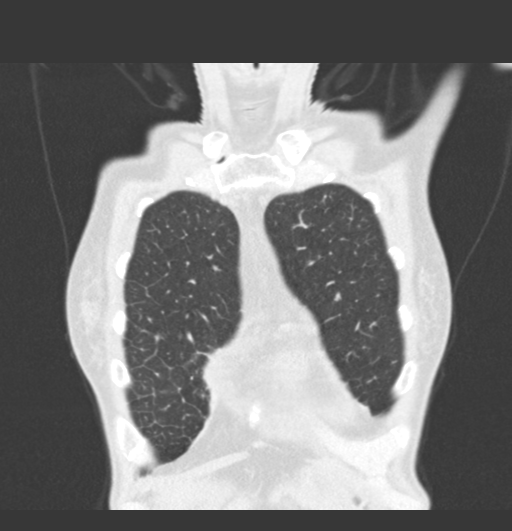
[im 55/136  lung]
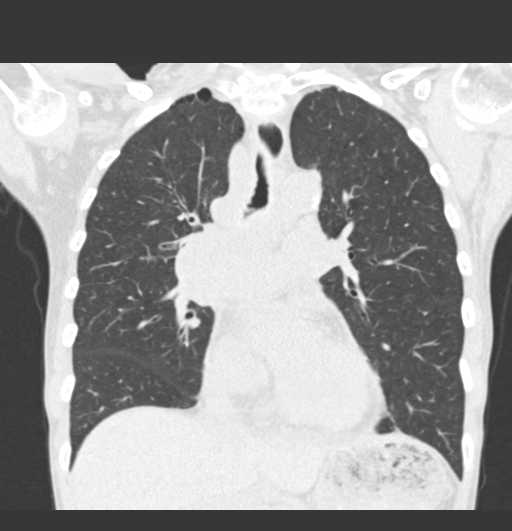
[im 82/136  lung]
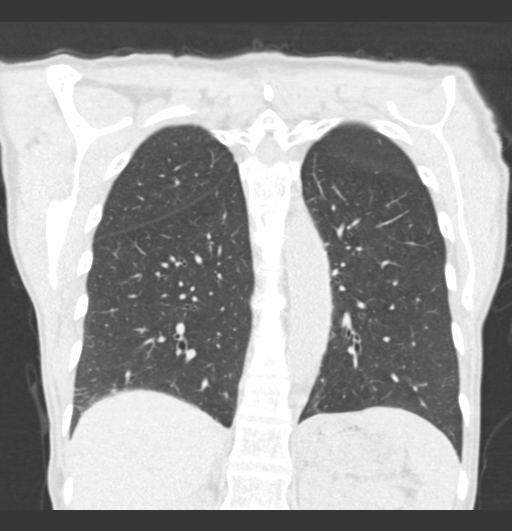

[15 of 36 positions shown; findings below may reference images not displayed]

FINDINGS: Cardiovascular: Heart size normal. Small pericardial effusion or
thickening. No thoracic aortic aneurysm. Scattered atheromatous
calcifications in the coronary arteries, aortic arch and visualized
proximal abdominal aorta.

Mediastinum/Nodes: Bulky confluent right hilar, subcarinal, right
paratracheal, and AP window adenopathy. Supraclavicular adenopathy
left greater than right.

Lungs/Pleura: No pleural effusion. No pneumothorax. Small apical
subpleural blebs. 1 cm region of consolidation/atelectasis medially
in the right middle lobe. 6 mm angular nodule in the anterior basal
segment right lower lobe image 128/4. 5 mm angular nodule in the
lateral lingula image 74/4.

Upper Abdomen: 1.2 cm subcapsular low-attenuation lesion in hepatic
segment 5, incompletely visualized. Visualized portions of adrenals
unremarkable. No adenopathy or acute findings.

Musculoskeletal: DJD in bilateral shoulders. No fracture or
worrisome bone lesion.
IMPRESSION: 1. Bulky right hilar and mediastinal adenopathy and bilateral
supraclavicular adenopathy, suggesting lymphoma or metastatic
disease. The supraclavicular disease should be approachable for
ultrasound-guided core biopsy if needed.
2. Scattered small lung lesions as above, nonspecific. PET/CT may be
useful for further characterization.
3. 1.2 cm liver lesion, incompletely visualized, may represent cyst,
hemangioma, primary or secondary neoplasm. Liver MR or PET-CT may be
useful for further characterization.
4. Small pericardial effusion or thickening.
5. Coronary and Aortic Atherosclerosis (6SDS1-170.0).

These results will be called to the ordering clinician or
representative by the Radiologist Assistant, and communication
documented in the PACS or zVision Dashboard.

## 2020-04-29 IMAGING — CR DG CHEST 1V PORT
1 series · 1 of 1 positions shown · non-contrast
Comparison: 08/12/2018

CLINICAL DATA: Status post port placement

EXAM:
PORTABLE CHEST 1 VIEW

[portable]
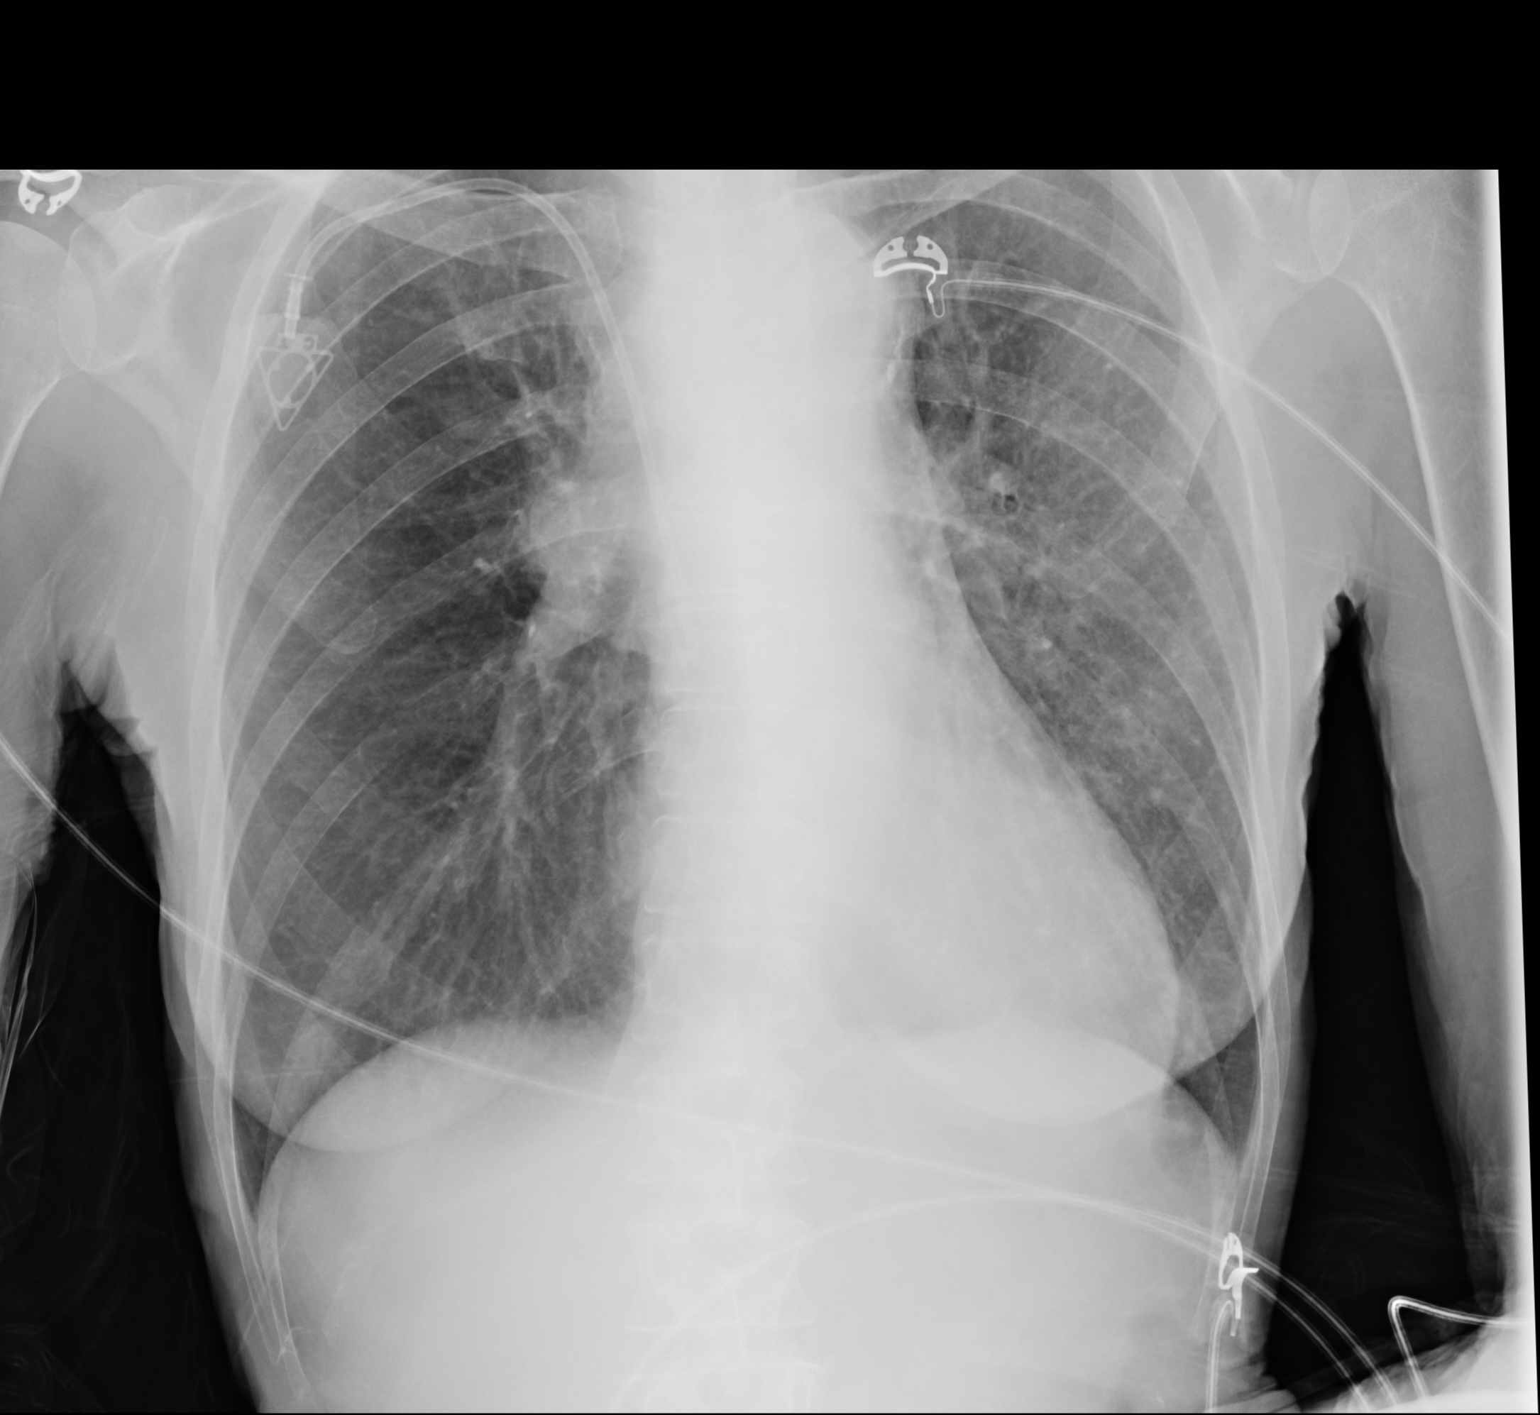

[1 of 1 positions shown; findings below may reference images not displayed]

FINDINGS: Cardiac shadows within normal limits. Fullness in the right hilar
and paratracheal region on the right is noted consistent with the
known lymphadenopathy. New right-sided chest wall port is seen with
tip in the distal superior vena cava.. No pneumothorax is noted.
IMPRESSION: No pneumothorax following port placement.

## 2021-09-30 ENCOUNTER — Other Ambulatory Visit: Payer: Self-pay | Admitting: Family Medicine
